# Patient Record
Sex: Male | Born: 1950 | Hispanic: Refuse to answer | Marital: Single | Smoking: Former smoker
Health system: Southern US, Community
[De-identification: ages and names within clinical notes are randomized; demographics above are authoritative.]

## PROBLEM LIST (undated history)

## (undated) DIAGNOSIS — I639 Cerebral infarction, unspecified: Secondary | ICD-10-CM

## (undated) DIAGNOSIS — C329 Malignant neoplasm of larynx, unspecified: Secondary | ICD-10-CM

## (undated) DIAGNOSIS — C3490 Malignant neoplasm of unspecified part of unspecified bronchus or lung: Secondary | ICD-10-CM

## (undated) DIAGNOSIS — Z930 Tracheostomy status: Secondary | ICD-10-CM

## (undated) HISTORY — PX: TRACHEOSTOMY: SUR1362

## (undated) HISTORY — PX: OTHER SURGICAL HISTORY: SHX169

---

## 2009-04-23 ENCOUNTER — Ambulatory Visit: Payer: Self-pay | Admitting: *Deleted

## 2009-05-14 ENCOUNTER — Ambulatory Visit: Payer: Self-pay | Admitting: *Deleted

## 2009-05-27 ENCOUNTER — Ambulatory Visit (HOSPITAL_COMMUNITY): Admission: RE | Admit: 2009-05-27 | Discharge: 2009-05-27 | Payer: Self-pay | Admitting: *Deleted

## 2009-05-27 ENCOUNTER — Ambulatory Visit: Payer: Self-pay | Admitting: *Deleted

## 2009-06-18 ENCOUNTER — Ambulatory Visit: Payer: Self-pay | Admitting: *Deleted

## 2010-01-18 ENCOUNTER — Ambulatory Visit: Payer: Self-pay | Admitting: Surgery

## 2010-08-02 ENCOUNTER — Ambulatory Visit: Payer: Self-pay | Admitting: Surgery

## 2011-02-07 ENCOUNTER — Ambulatory Visit: Payer: Self-pay | Admitting: Surgery

## 2011-03-28 LAB — POCT I-STAT, CHEM 8
Calcium, Ion: 1.13 mmol/L (ref 1.12–1.32)
Chloride: 101 mEq/L (ref 96–112)
HCT: 45 % (ref 39.0–52.0)
Potassium: 4.6 mEq/L (ref 3.5–5.1)
Sodium: 135 mEq/L (ref 135–145)

## 2011-05-03 NOTE — Op Note (Signed)
NAMEGLENDON, William Hoover                 ACCOUNT NO.:  [id number]   MEDICAL RECORD NO.:  [id number]          PATIENT TYPE:  AMB   LOCATION:  SDS                          FACILITY:  [facility]   PHYSICIAN:  [name], M.D.    DATE OF BIRTH:  1951-02-11   DATE OF PROCEDURE:  05/27/2009  DATE OF DISCHARGE:  05/27/2009                               OPERATIVE REPORT   DIAGNOSIS:  Right lower extremity claudication.   PROCEDURE:  Abdominal aortogram with bilateral lower extremity runoff  arteriography.   ACCESS:  Right common femoral artery 5-French sheath.   COMPLICATIONS:  None apparent.   CONTRAST:  120 mL Visipaque.   CLINICAL NOTE:  William Hoover is a 60 year old male with advanced  peripheral vascular disease, history of bilateral iliac stent with  extensive stents in the right superficial femoral artery.  He has a left  femoral-popliteal bypass.  He has limiting claudication in his right  lower extremity, which has been worsening, brought to the cath lab at  this time for further workup with arteriography.   PROCEDURE NOTE:  The patient was brought to the cath lab in stable  condition.  Placed in supine position.  Right groin was prepped and  draped in sterile fashion, administered 50 mcg of fentanyl  intravenously.   Skin and subcutaneous tissue of right groin was instilled with 1%  Xylocaine.  An 18-gauge induced in right common femoral artery.  A 0.035  Versicor guidewire advanced through the needle onto the mid abdominal  aorta.  The 5-French sheath advanced over guidewire, dilator removed,  sheath flushed with heparin saline solution.   Pigtail catheter was then advanced over guidewire.  This was positioned  in the suprarenal aorta.  Standard AP abdominal aortogram obtained.  The  renal arteries were patent bilaterally with mild proximal stenoses.  Brisk flow was noted through the superior mesenteric and celiac vessels.  The inferior mesenteric artery appeared small.   The  patient is status post bilateral iliac stenting.  Extensive stents  along the left common and external iliac arteries.  Left hypogastric  artery was occluded.  Otherwise, no significant left iliac stenosis.  The right iliac system revealed a common iliac stent, hypogastric artery  patent and external iliac artery patent.   Pigtail catheter brought down the aortic bifurcation.  Bilateral lower  extremity runoff arteriography obtained.  This revealed the common  femoral arteries to be patent bilaterally.   Left leg revealed an occluded superficial femoral artery with a patent  femoral-popliteal bypass to the above-knee popliteal segment.  In fact,  intact popliteal artery distal over the bypass with 3-vessel tibial  artery runoff.   Right lower extremity revealed patent profunda femoris artery.  The  right superficial femoral artery was occluded, status post stenting.  The proximal right popliteal artery was occluded.  Popliteal artery at  the knee joint was also occluded.  The profunda femoris artery then  provided extensive collaterals with reconstitute at the proximal tibial  vessels with 3-vessel runoff.   This completed the arteriogram procedure.  No apparent complications.  Guidewire  reinserted and pigtail catheter removed.   FINAL IMPRESSION:  1. Single bilateral renal arteries with mild proximal stenosis.  2. Patent infrarenal aortic segment.  3. Bilateral iliac stents with left hypogastric artery occlusion.  4. Patent left femoral-popliteal bypass with 3-vessel runoff.  5. Occluded right superficial femoral artery and popliteal artery with      reconstitution of tibial vessels x3.   DISPOSITION:  These results will be reviewed further with the patient  and family.  He has limited claudication present and conservative  management will be recommended.      [name], M.D.  Electronically Signed     [name], M.D.  Electronically Signed    PGH/MEDQ  D:   05/27/2009  T:  05/28/2009  Job:  [id number]

## 2011-05-03 NOTE — Assessment & Plan Note (Signed)
OFFICE VISIT   William Hoover, William Hoover  DOB:  May 12, 1951                                       05/14/2009  [id number]#:[id number]   The patient was seen in the office earlier this month with right lower  extremity claudication symptoms.  These have been worsening over the  past couple of years.  He has had multiple previous interventions in his  right lower extremity which have now failed, including PTA with Viabahn  stenting of his right superficial femoral artery.   I reviewed his arteriograms which were carried out over a year ago.  These do reveal right SFA occlusion with multiple stents in place.  Due  to these being over a year old I do think we should go ahead and do  another arteriogram.  This is scheduled for May 27, 2009.   P. Liliane Bade, M.D.  Electronically Signed   PGH/MEDQ  D:  05/14/2009  T:  05/15/2009  Job:  [id number]   cc:   Carlye Grippe, D.O.

## 2011-05-03 NOTE — Assessment & Plan Note (Signed)
OFFICE VISIT   William Hoover, William Hoover  DOB:  05/19/1951                                       06/18/2009  EAVWU#:[id number]   The patient returned to the office today after undergoing an  arteriogram.  This reveals a patent left femoral popliteal bypass with  three-vessel runoff.  Right lower extremity revealed a patent profunda  femoral.  Right superficial femoral artery was occluded status post  stenting.  The proximal right popliteal artery was occluded.  The  popliteal artery is also occluded at the knee joint.  Extensive profunda  collaterals were noted to reconstitute the proximal tibial vessels which  were intact.   Revascularization would require a femoral tibial bypass.  This magnitude  of surgery is typically limited to limb salvage operations.  His  symptoms are strictly claudication at this time.  He no limb-threatening  ischemia.   PHYSICAL EXAMINATION:  Blood pressure 153/78, pulse of 69 per minute.  Lower Extremity:  Reveals intact femoral pulses bilaterally.  Left  popliteal and dorsalis pedis pulses are intact.  He has no palpable  pulses in the popliteal or pedals in the right foot.   The patient's symptoms are related to limiting claudication.  I have  recommended conservative management, as he would require a femoral  tibial bypass for revascularization.  Should he progress to limb  salvage, this would then be indicated.  I will plan to follow up with  him again in 6 months with an ankle brachial index.   [name], M.D.  Electronically Signed   PGH/MEDQ  D:  06/18/2009  T:  06/19/2009  Job:  [id number]   cc:   Mia Creek, D.O.

## 2011-05-03 NOTE — Consult Note (Signed)
VASCULAR SURGERY CONSULTATION   William Hoover, William Hoover  DOB:  [birth year]                                       04/23/2009  [id number]#:[id number]   REFERRING PHYSICIAN:  Romie Jumper, DO.   REFERRAL DIAGNOSIS:  Right lower extremity claudication.   HISTORY:  The patient is a 60 year old male with a history of tobacco  abuse referred for evaluation of significant claudication symptoms in  his right lower extremity.   He has a complex history of peripheral vascular disease.  He has  previously undergone left femoral popliteal bypass by Dr. Conard Novak in  [facility] with thrombosis and thrombolytic therapy.  Also underwent  left lower extremity four compartment fasciotomy in 2006.   He has had multiple procedures on his right lower extremity.  These have  all been percutaneous interventions.  He has had PTA with Viabahn  stenting of his right superficial femoral artery.  He has also had an  atherectomy of his right superficial femoral artery.   He now complains of recurrent claudication symptoms in his right lower  extremity.  These have been present for over a year.  Has undergone a  repeat arteriogram which reveals his right superficial femoral artery to  be occluded at its origin, severe tibial vessel disease.   He does continue to smoke about a half a pack of cigarettes daily.  Has  a history of left thoracotomy for lung cancer in 2006.   PAST MEDICAL HISTORY:  1. Status post left upper lobectomy for lung cancer.  2. Peripheral vascular disease status post left fem-pop and multiple      percutaneous interventional procedures right superficial femoral      artery.  3. COPD.  4. 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   MEDICATIONS:  1. Plavix 75 mg daily.  2. Aspirin 81 mg daily.  3. Spiriva one once daily.  4. Pulmicort 2 puffs b.i.d.  5. Albuterol q.i.d.  6. ProAir HFA p.r.n.   ALLERGIES:  Hydrocodone.   SOCIAL HISTORY:  The patient is married.  Two children from previous  marriage.  On long-term disability.  Smokes one half pack of cigarettes  daily.  No regular alcohol use.   FAMILY HISTORY:  Father deceased age 31 with a history of heart disease.  Mother living age 38.   REVIEW OF SYSTEMS:  Refer to patient encounter form.  The patient notes  shortness of breath with exertion.  Chronic cough.  History of some  wheezing.  He does have joint discomfort.  History of some clotting  abnormalities.  Refer to patient encounter form.   PHYSICAL EXAMINATION:  General:  A thin 60 year old male.  No acute  distress.  Alert and oriented.  Vital signs:  BP 164/96, pulse 78 per  minute, respirations 18 per minute.  HEENT:  Unremarkable.  Neck:  Supple.  No thyromegaly or adenopathy.  Chest:  Equal air entry  bilaterally.  No rales or rhonchi.  No wheezing.  Cardiovascular:  No  carotid bruits.  Normal heart sounds without murmurs.  Regular rate and  rhythm.  No gallops or rubs.  Abdomen:  Soft.  Nontender.  No masses or  organomegaly.  Normal bowel sounds.  Lower extremities:  2+ femoral  pulses bilaterally.  1+ left popliteal and dorsalis pedis.  Absent  posterior tibial.  Absent right popliteal, posterior tibial and dorsalis  pedis pulses.  No ankle edema.  Neurological:  Cranial nerves intact.  Strength equal bilaterally.  1+ reflexes.  Skin:  Intact without rash or  ulceration.   INVESTIGATIONS:  Lower extremity Doppler reveals ABI 1.0 in the left  leg, right leg 0.43 with monophasic arterial waveforms.   IMPRESSION:  1. Peripheral vascular disease with patent left femoral-popliteal      bypass.  Right superficial femoral artery occlusion with      significant claudication symptoms.  2. Tobacco abuse.  3. Chronic obstructive pulmonary disease.  4. Lung cancer.   RECOMMENDATIONS:  The patient does have a severe limiting claudication.  Have recommended discontinuation of tobacco.  Will obtain films from  [facility] of most recent arteriogram.   Plan followup and  further evaluation once these are reviewed.   [name], M.D.  Electronically Signed  PGH/MEDQ  D:  04/23/2009  T:  04/24/2009  Job:  [id number]

## 2011-12-09 ENCOUNTER — Other Ambulatory Visit (HOSPITAL_COMMUNITY): Payer: Self-pay | Admitting: Internal Medicine

## 2011-12-09 DIAGNOSIS — I428 Other cardiomyopathies: Secondary | ICD-10-CM

## 2011-12-21 ENCOUNTER — Encounter (HOSPITAL_COMMUNITY)
Admission: RE | Admit: 2011-12-21 | Discharge: 2011-12-21 | Disposition: A | Discharge status: Home / Self Care | Payer: Medicare HMO | Source: Ambulatory Visit | Attending: Internal Medicine | Admitting: Internal Medicine

## 2011-12-21 DIAGNOSIS — I428 Other cardiomyopathies: Secondary | ICD-10-CM | POA: Insufficient documentation

## 2011-12-22 MED ORDER — TECHNETIUM TC 99M-LABELED RED BLOOD CELLS IV KIT
30.0000 | PACK | Freq: Once | INTRAVENOUS | Status: AC | PRN
  Administered 2011-12-21: 30 via INTRAVENOUS

## 2012-09-03 ENCOUNTER — Encounter: Payer: Self-pay | Admitting: Vascular Surgery

## 2014-01-24 ENCOUNTER — Encounter (HOSPITAL_COMMUNITY): Payer: Self-pay | Admitting: Emergency Medicine

## 2014-01-24 ENCOUNTER — Inpatient Hospital Stay (HOSPITAL_COMMUNITY)
Admission: EM | Admit: 2014-01-24 | Discharge: 2014-02-16 | DRG: 871 | Disposition: E | Payer: Medicare HMO | Attending: Internal Medicine | Admitting: Internal Medicine

## 2014-01-24 ENCOUNTER — Emergency Department (HOSPITAL_COMMUNITY): Payer: Medicare HMO

## 2014-01-24 DIAGNOSIS — E872 Acidosis, unspecified: Secondary | ICD-10-CM | POA: Diagnosis present

## 2014-01-24 DIAGNOSIS — R69 Illness, unspecified: Secondary | ICD-10-CM | POA: Diagnosis present

## 2014-01-24 DIAGNOSIS — Z7902 Long term (current) use of antithrombotics/antiplatelets: Secondary | ICD-10-CM

## 2014-01-24 DIAGNOSIS — E871 Hypo-osmolality and hyponatremia: Secondary | ICD-10-CM

## 2014-01-24 DIAGNOSIS — R6520 Severe sepsis without septic shock: Secondary | ICD-10-CM

## 2014-01-24 DIAGNOSIS — Z7982 Long term (current) use of aspirin: Secondary | ICD-10-CM

## 2014-01-24 DIAGNOSIS — Z79899 Other long term (current) drug therapy: Secondary | ICD-10-CM

## 2014-01-24 DIAGNOSIS — R64 Cachexia: Secondary | ICD-10-CM | POA: Diagnosis present

## 2014-01-24 DIAGNOSIS — T68XXXA Hypothermia, initial encounter: Secondary | ICD-10-CM

## 2014-01-24 DIAGNOSIS — N179 Acute kidney failure, unspecified: Secondary | ICD-10-CM

## 2014-01-24 DIAGNOSIS — Z515 Encounter for palliative care: Secondary | ICD-10-CM

## 2014-01-24 DIAGNOSIS — R198 Other specified symptoms and signs involving the digestive system and abdomen: Secondary | ICD-10-CM | POA: Diagnosis present

## 2014-01-24 DIAGNOSIS — Z87891 Personal history of nicotine dependence: Secondary | ICD-10-CM

## 2014-01-24 DIAGNOSIS — IMO0002 Reserved for concepts with insufficient information to code with codable children: Secondary | ICD-10-CM

## 2014-01-24 DIAGNOSIS — E875 Hyperkalemia: Secondary | ICD-10-CM

## 2014-01-24 DIAGNOSIS — Z930 Tracheostomy status: Secondary | ICD-10-CM

## 2014-01-24 DIAGNOSIS — Z8521 Personal history of malignant neoplasm of larynx: Secondary | ICD-10-CM

## 2014-01-24 DIAGNOSIS — I69998 Other sequelae following unspecified cerebrovascular disease: Secondary | ICD-10-CM

## 2014-01-24 DIAGNOSIS — K631 Perforation of intestine (nontraumatic): Secondary | ICD-10-CM

## 2014-01-24 DIAGNOSIS — Z85118 Personal history of other malignant neoplasm of bronchus and lung: Secondary | ICD-10-CM

## 2014-01-24 DIAGNOSIS — R6521 Severe sepsis with septic shock: Secondary | ICD-10-CM

## 2014-01-24 DIAGNOSIS — R29898 Other symptoms and signs involving the musculoskeletal system: Secondary | ICD-10-CM | POA: Diagnosis present

## 2014-01-24 DIAGNOSIS — Z888 Allergy status to other drugs, medicaments and biological substances status: Secondary | ICD-10-CM

## 2014-01-24 DIAGNOSIS — I10 Essential (primary) hypertension: Secondary | ICD-10-CM

## 2014-01-24 DIAGNOSIS — A419 Sepsis, unspecified organism: Principal | ICD-10-CM

## 2014-01-24 DIAGNOSIS — Z66 Do not resuscitate: Secondary | ICD-10-CM | POA: Diagnosis present

## 2014-01-24 DIAGNOSIS — Z885 Allergy status to narcotic agent status: Secondary | ICD-10-CM

## 2014-01-24 DIAGNOSIS — J189 Pneumonia, unspecified organism: Secondary | ICD-10-CM | POA: Diagnosis present

## 2014-01-24 DIAGNOSIS — R0902 Hypoxemia: Secondary | ICD-10-CM

## 2014-01-24 HISTORY — DX: Malignant neoplasm of unspecified part of unspecified bronchus or lung: C34.90

## 2014-01-24 HISTORY — DX: Malignant neoplasm of larynx, unspecified: C32.9

## 2014-01-24 HISTORY — DX: Tracheostomy status: Z93.0

## 2014-01-24 HISTORY — DX: Cerebral infarction, unspecified: I63.9

## 2014-01-24 LAB — CBC WITH DIFFERENTIAL/PLATELET
BASOS ABS: 0 10*3/uL (ref 0.0–0.1)
Basophils Relative: 0 % (ref 0–1)
EOS PCT: 0 % (ref 0–5)
Eosinophils Absolute: 0 10*3/uL (ref 0.0–0.7)
HEMATOCRIT: 30.7 % — AB (ref 39.0–52.0)
Hemoglobin: 10.1 g/dL — ABNORMAL LOW (ref 13.0–17.0)
LYMPHS ABS: 1.2 10*3/uL (ref 0.7–4.0)
LYMPHS PCT: 17 % (ref 12–46)
MCH: 29.4 pg (ref 26.0–34.0)
MCHC: 32.9 g/dL (ref 30.0–36.0)
MCV: 89.5 fL (ref 78.0–100.0)
MONOS PCT: 6 % (ref 3–12)
Monocytes Absolute: 0.4 10*3/uL (ref 0.1–1.0)
Neutro Abs: 5.3 10*3/uL (ref 1.7–7.7)
Neutrophils Relative %: 77 % (ref 43–77)
PLATELETS: ADEQUATE 10*3/uL (ref 150–400)
RBC: 3.43 MIL/uL — AB (ref 4.22–5.81)
RDW: 15.3 % (ref 11.5–15.5)
WBC MORPHOLOGY: INCREASED
WBC: 6.9 10*3/uL (ref 4.0–10.5)

## 2014-01-24 LAB — COMPREHENSIVE METABOLIC PANEL
ALBUMIN: 2.3 g/dL — AB (ref 3.5–5.2)
ALK PHOS: 82 U/L (ref 39–117)
ALT: 94 U/L — AB (ref 0–53)
AST: 136 U/L — AB (ref 0–37)
BILIRUBIN TOTAL: 0.9 mg/dL (ref 0.3–1.2)
BUN: 58 mg/dL — AB (ref 6–23)
CHLORIDE: 87 meq/L — AB (ref 96–112)
CO2: 11 mEq/L — ABNORMAL LOW (ref 19–32)
Calcium: 9.4 mg/dL (ref 8.4–10.5)
Creatinine, Ser: 3.25 mg/dL — ABNORMAL HIGH (ref 0.50–1.35)
GFR calc Af Amer: 22 mL/min — ABNORMAL LOW (ref >90)
GFR calc non Af Amer: 19 mL/min — ABNORMAL LOW (ref >90)
Glucose, Bld: 92 mg/dL (ref 70–99)
POTASSIUM: 7 meq/L — AB (ref 3.7–5.3)
SODIUM: 128 meq/L — AB (ref 137–147)
TOTAL PROTEIN: 6.8 g/dL (ref 6.0–8.3)

## 2014-01-24 LAB — URINALYSIS, ROUTINE W REFLEX MICROSCOPIC
GLUCOSE, UA: 100 mg/dL — AB
HGB URINE DIPSTICK: NEGATIVE
KETONES UR: NEGATIVE mg/dL
LEUKOCYTES UA: NEGATIVE
Nitrite: NEGATIVE
PH: 5 (ref 5.0–8.0)
PROTEIN: 30 mg/dL — AB
Specific Gravity, Urine: 1.023 (ref 1.005–1.030)
Urobilinogen, UA: 0.2 mg/dL (ref 0.0–1.0)

## 2014-01-24 LAB — POCT I-STAT TROPONIN I: Troponin i, poc: 0.05 ng/mL (ref 0.00–0.08)

## 2014-01-24 LAB — CG4 I-STAT (LACTIC ACID): Lactic Acid, Venous: 10.46 mmol/L — ABNORMAL HIGH (ref 0.5–2.2)

## 2014-01-24 LAB — URINE MICROSCOPIC-ADD ON

## 2014-01-24 MED ORDER — SODIUM CHLORIDE 0.9 % IV SOLN
1000.0000 mL | Freq: Once | INTRAVENOUS | Status: AC
  Administered 2014-01-24: 1000 mL via INTRAVENOUS

## 2014-01-24 MED ORDER — VANCOMYCIN HCL 500 MG IV SOLR
500.0000 mg | Freq: Two times a day (BID) | INTRAVENOUS | Status: DC
  Filled 2014-01-24: qty 500

## 2014-01-24 MED ORDER — DEXTROSE 5 % IV SOLN
1.0000 g | Freq: Three times a day (TID) | INTRAVENOUS | Status: DC
  Filled 2014-01-24: qty 1

## 2014-01-24 MED ORDER — DEXTROSE 5 % IV SOLN
2.0000 g | Freq: Once | INTRAVENOUS | Status: DC
  Filled 2014-01-24: qty 2

## 2014-01-24 MED ORDER — VANCOMYCIN HCL IN DEXTROSE 1-5 GM/200ML-% IV SOLN
1000.0000 mg | Freq: Once | INTRAVENOUS | Status: AC
  Administered 2014-01-24: 1000 mg via INTRAVENOUS
  Filled 2014-01-24: qty 200

## 2014-01-24 MED ORDER — ALBUTEROL SULFATE (2.5 MG/3ML) 0.083% IN NEBU
5.0000 mg | INHALATION_SOLUTION | Freq: Once | RESPIRATORY_TRACT | Status: AC
  Administered 2014-01-24: 5 mg via RESPIRATORY_TRACT
  Filled 2014-01-24: qty 6

## 2014-01-24 MED ORDER — SODIUM CHLORIDE 0.9 % IV SOLN
1000.0000 mL | INTRAVENOUS | Status: DC
  Administered 2014-01-24: 1000 mL via INTRAVENOUS

## 2014-01-24 MED ORDER — NOREPINEPHRINE BITARTRATE 1 MG/ML IJ SOLN
2.0000 ug/min | INTRAVENOUS | Status: DC
  Administered 2014-01-24: 3 ug/min via INTRAVENOUS
  Filled 2014-01-24: qty 4

## 2014-01-24 NOTE — ED Notes (Signed)
Bed: RESA Expected date: 01/19/2014 Expected time: 8:54 PM Means of arrival: Ambulance Comments: 63 yo M  Cancer, trach, low oxygen sat

## 2014-01-24 NOTE — ED Notes (Signed)
CRITICAL VALUE ALERT  Critical value received:  Potassium 7.0 mEq/L Date of notification:  02/14/2014 Time of notification:  2239 Critical value read back:yes Nurse who received alert:  Renita Papa, RN Primary Nurse notified: Face-to-face in Res A at 32 MD notified:  EDP Aline Brochure and EDPA at bedside in Res A Time of first page:  2240

## 2014-01-24 NOTE — Progress Notes (Signed)
   CARE MANAGEMENT ED NOTE 02/04/2014  Patient:  William Hoover, William Hoover   Account Number:  [id number]  Date Initiated:  02/02/2014  Documentation initiated by:  Livia Snellen  Subjective/Objective Assessment:   Patient presents to Ed with decreased saturations to 77%     Subjective/Objective Assessment Detail:   Patient with trach  colar, just recently diagnosed with pnuemonia, and history of metastatic lung cancer.  Temp 94.5.  Lactic acid level  10.46.     Action/Plan:   Patient patienton 8 liters of oxygen   Action/Plan Detail:   Anticipated DC Date:       Status Recommendation to Physician:   Result of Recommendation:    Other ED [place]  Other  PCP issues    Choice offered to / List presented to:            Status of service:  Completed, signed off  ED Comments:   ED Comments Detail:  As per EMs paperwork, patient is from [facility] nursing [facility].  Patient's pcp at [facility] is Dr. Lysle Rubens.  System updated.

## 2014-01-24 NOTE — ED Provider Notes (Signed)
CSN: [id number]     Arrival date & time 02/05/2014  2107 History   First MD Initiated Contact with Patient 02/07/2014 2111     Chief Complaint  Patient presents with  . Shortness of Breath   (Consider location/radiation/quality/duration/timing/severity/associated sxs/prior Treatment) The history is provided by the patient, medical records and the EMS personnel. No language interpreter was used.    William Hoover is a 63 y.o. male  with a hx of metastatic lung cancer, pneumonia diagnosed yesterday presents to the Emergency Department via EMS from [facility] skilled nursing facility after his oxygen saturation dropped to 77% just prior to arrival.  EMS reports patient has been lethargic since their arrival with oxygen saturations in the 70s to 80s on 6 L with a trach collar.  Level 5 caveat due to altered mental status and acuity.  MAR indicates the initiation of Levaquin.  Patient is taking Plavix for anticoagulation.     Past Medical History  Diagnosis Date  . Laryngeal cancer   . Lung cancer   . Tracheostomy in place   . Stroke    Past Surgical History  Procedure Laterality Date  . Tracheostomy    . Lung lobectomy     History reviewed. No pertinent family history. History  Substance Use Topics  . Smoking status: Former Research scientist (life sciences)  . Smokeless tobacco: Not on file  . Alcohol Use: No    Review of Systems  Unable to perform ROS: Acuity of condition    Allergies  Adhesive; Hydrocodone; and Statins  Home Medications   Current Outpatient Rx  Name  Route  Sig  Dispense  Refill  . albuterol (PROVENTIL) (2.5 MG/3ML) 0.083% nebulizer solution   Nebulization   Take 2.5 mg by nebulization every 8 (eight) hours.         Marland Kitchen aspirin 81 MG chewable tablet   Oral   Chew 81 mg by mouth daily.         . carvedilol (COREG) 3.125 MG tablet   Oral   Take 3.125 mg by mouth 2 (two) times daily with a meal.         . clopidogrel (PLAVIX) 75 MG tablet   Oral   Take 75 mg by mouth daily.          Marland Kitchen doxazosin (CARDURA) 2 MG tablet   Oral   Take 2 mg by mouth daily.         Marland Kitchen ipratropium-albuterol (DUONEB) 0.5-2.5 (3) MG/3ML SOLN   Inhalation   Inhale 3 mLs into the lungs every 4 (four) hours as needed.          Marland Kitchen levofloxacin (LEVAQUIN) 500 MG tablet   Oral   Take 500 mg by mouth daily.         Marland Kitchen levothyroxine (SYNTHROID, LEVOTHROID) 50 MCG tablet   Oral   Take 50 mcg by mouth daily before breakfast.          . lisinopril (PRINIVIL,ZESTRIL) 2.5 MG tablet   Oral   Take 2.5 mg by mouth daily.         Marland Kitchen PROAIR HFA 108 (90 BASE) MCG/ACT inhaler   Inhalation   Inhale 1 puff into the lungs every 6 (six) hours as needed.           BP 44/17  Pulse 118  Temp(Src) 94.5 F (34.7 C) (Core (Comment))  Resp 25  SpO2 100% Physical Exam  Nursing note and vitals reviewed. Constitutional: He appears well-developed. He appears distressed.  Awake,  cachectic, distressed, mild diaphoresis  HENT:  Head: Normocephalic and atraumatic.  Mouth/Throat: No oropharyngeal exudate.  Eyes: Conjunctivae are normal. Pupils are equal, round, and reactive to light. No scleral icterus.  Neck: Neck supple.  Well-healed stoma, no cannulation, trach collar in place Palpable carotid pulses  Cardiovascular: Normal rate, regular rhythm and normal heart sounds.   No palpable radial pulses  Pulmonary/Chest: Accessory muscle usage present. Tachypnea noted. He is in respiratory distress. He has decreased breath sounds (Throughout). He has no wheezes. He has no rales. He exhibits no tenderness and no bony tenderness.  Patient tachypneic with accessory muscle use, mild respiratory distress Decreased breath sounds throughout with rales in the right lower  Abdominal: Soft. Bowel sounds are normal. He exhibits no distension and no mass. There is no tenderness. There is no rebound and no guarding.  Abdomen soft and nontender  Musculoskeletal: Normal range of motion. He exhibits no edema.   Lymphadenopathy:    He has no cervical adenopathy.  Neurological: He is alert. GCS eye subscore is 4. GCS verbal subscore is 1. GCS motor subscore is 5.  GCS 10  Skin: Skin is warm. He is diaphoretic.  Jaundiced appearing  Psychiatric: He has a normal mood and affect.    ED Course  Procedures (including critical care time) Labs Review Labs Reviewed  CBC WITH DIFFERENTIAL - Abnormal; Notable for the following:    RBC 3.43 (*)    Hemoglobin 10.1 (*)    HCT 30.7 (*)    All other components within normal limits  COMPREHENSIVE METABOLIC PANEL - Abnormal; Notable for the following:    Sodium 128 (*)    Potassium 7.0 (*)    Chloride 87 (*)    CO2 11 (*)    BUN 58 (*)    Creatinine, Ser 3.25 (*)    Albumin 2.3 (*)    AST 136 (*)    ALT 94 (*)    GFR calc non Af Amer 19 (*)    GFR calc Af Amer 22 (*)    All other components within normal limits  URINALYSIS, ROUTINE W REFLEX MICROSCOPIC - Abnormal; Notable for the following:    Color, Urine AMBER (*)    APPearance CLOUDY (*)    Glucose, UA 100 (*)    Bilirubin Urine SMALL (*)    Protein, ur 30 (*)    All other components within normal limits  CG4 I-STAT (LACTIC ACID) - Abnormal; Notable for the following:    Lactic Acid, Venous 10.46 (*)    All other components within normal limits  CULTURE, BLOOD (ROUTINE X 2)  URINE CULTURE  URINE MICROSCOPIC-ADD ON  POCT I-STAT TROPONIN I   Imaging Review Ct Abdomen Pelvis Wo Contrast  02/06/2014   CLINICAL DATA:  63 year old male -unresponsive. Probable pneumoperitoneum identified on recent chest radiograph.  EXAM: CT ABDOMEN AND PELVIS WITHOUT CONTRAST  TECHNIQUE: Multidetector CT imaging of the abdomen and pelvis was performed following the standard protocol without intravenous contrast.  COMPARISON:  09/04/2013 chest CT and 02/07/2014 chest radiograph  FINDINGS: A large amount of pneumoperitoneum within the mid/ upper abdomen with complex fluid containing foci of gas in the pelvis and  pericolic gutters - compatible with bowel perforation. Difficult to determine the source of this perforation on the study.  Bibasilar atelectasis, right greater than left noted.  The liver, spleen, adrenal glands, pancreas and gallbladder are unremarkable. Please note that parenchymal abnormalities may be missed without intravenous contrast.  Abdominal aortic atherosclerotic calcifications are  noted without aneurysm. No definite enlarged lymph nodes identified.  A Foley catheter within the bladder is present.  The right psoas muscle appears enlarged in relation to the left psoas muscle and suspicious for hematoma.  No acute or suspicious bony abnormalities are present.  IMPRESSION: Evidence of bowel perforation with pneumoperitoneum and complex fluid and gas within the abdomen/pelvis. The site of bowel perforation is not well delineated on this study.  Probable right psoas muscle hematoma.  Critical Value/emergent results were called by telephone at the time of interpretation on 02/01/2014 at 11:30 PM to Dr. Abigail Butts , who verbally acknowledged these results.   Electronically Signed   By: Hassan Rowan M.D.   On: 02/12/2014 23:38   Dg Chest Port 1 View  02/12/2014   CLINICAL DATA:  63 year old male central line placement. Initial encounter. Pneumoperitoneum.  EXAM: PORTABLE CHEST - 1 VIEW  COMPARISON:  2159 hr the same day and earlier.  FINDINGS: Portable AP semi upright view at at 2315 hrs.  Continued pneumoperitoneum. Stable cardiac size and mediastinal contours. Right IJ approach central line placed, tip partially obscured by the thoracic spinal hardware, but felt to be identified at the level of the lower right SVC. No pneumothorax. No confluent pulmonary opacity or pulmonary edema. Stable cardiac size and mediastinal contours.  IMPRESSION: 1. Right IJ central line placed, tip at the level of the lower SVC. No pneumothorax identified. 2. Continued large volume pneumoperitoneum, see CT Abdomen and Pelvis  reported separately.   Electronically Signed   By: Lars Pinks M.D.   On: 01/19/2014 23:30   Dg Chest Port 1 View  01/30/2014   CLINICAL DATA:  Hypoxia  EXAM: PORTABLE CHEST - 1 VIEW  COMPARISON:  Prior radiograph  FINDINGS: Cardiac and mediastinal silhouettes are unchanged. Surgical clips overlie the left hilum. Spinal fixation hardware again noted.  Lungs are hypoinflated patchy and linear right basilar opacities ill favored to reflect atelectasis. No definite focal infiltrate. No pulmonary edema or pleural effusion. No pneumothorax.  Gas lucency seen within the upper abdomen is compatible with free intraperitoneal air. There is a continuous diaphragm sign.  No acute osseus abnormality.  IMPRESSION: 1. Free intraperitoneal air within the partially visualized upper abdomen, concerning for perforated viscus. Dedicated cross-sectional imaging of the abdomen and pelvis is recommended. 2. Right basilar patchy opacities, likely atelectasis. No acute cardiopulmonary process. Critical Value/emergent results were called by telephone at the time of interpretation on 01/29/2014 at 10:20 PM to Dr. Abigail Butts , who verbally acknowledged these results.   Electronically Signed   By: Jeannine Boga M.D.   On: 01/22/2014 22:21    EKG Interpretation    Date/Time:  Friday January 24 2014 21:56:52 EST Ventricular Rate:  79 PR Interval:  175 QRS Duration: 121 QT Interval:  409 QTC Calculation: 469 R Axis:   73 Text Interpretation:  Sinus rhythm Left bundle branch block Confirmed by HARRISON  MD, FORREST ([id number]) on 01/27/2014 11:46:31 PM           CRITICAL CARE Performed by: Abigail Butts Total critical care time: 1 hour Critical care time was exclusive of separately billable procedures and treating other patients. Critical care was necessary to treat or prevent imminent or life-threatening deterioration. Critical care was time spent personally by me on the following activities: development  of treatment plan with patient and/or surrogate as well as nursing, discussions with consultants, evaluation of patient's response to treatment, examination of patient, obtaining history from patient or surrogate, ordering  and performing treatments and interventions, ordering and review of laboratory studies, ordering and review of radiographic studies, pulse oximetry and re-evaluation of patient's condition.   MDM   1. Perforated bowel   2. Hypoxia   3. Hypothermia   4. Hypertension   5. Hyperkalemia   6. Acute renal failure   7. Hyponatremia   8. Admission for end of life care   9. MODS (multiple organ dysfunction syndrome)   10. Septic shock     Keelon Zurn presents with altered mental status, hypoxia, hypotension and hypothermia.  Per EMS patient recently diagnosed with pneumonia and started on Levaquin.  Presumed sepsis at this point. We'll proceed accordingly.   9:59 PM Patient hypotensive, hypoxic and hypothermic at this time.  We'll treat empirically for healthcare acquired pneumonia and sepsis.     11:00PM She continues to be hypotensive. Chest x-ray without evidence of pneumonia but shows free air under the diaphragm.  Family at bedside now. Patient's son reports he fell 2 days ago and within 24 hours of severe abdominal cramping. He was not evaluated after his fall.    Patient no longer hypothermic but does remain mildly hypoxic.  We'll place central line.  Respiratory unable to obtain ABG due to hypertension.  11:54 PM Central line placed by Dr. Aline Brochure.  Discussed at length with patient's son and mother the status of the patient.  Patient with acute renal failure, creatinine 3.25 and no history of same.  Patient also with elevated potassium 7.0.  Mildly elevated AST and ALT. Patient without leukocytosis.  Less likely sepsis at this time, however patient with true left shift as he has increased bands on his differential.    Patient with mildly improving blood pressure on  Levophed.  Discussed CT abdomen pelvis discussed with Dr. Melanee Spry.  Evidence of bowel perforation with pneumoperitoneum and complex fluid and gas within the abdomen pelvis. Unable to determine site of bowel perforation the stomach of contrast. Patient also with likely psoas muscle hematoma.    Pending general surgery consult.  12:35 AM [facility] to evaluate.  Pt with MAP 45 on levophed 21mcg.  On reevaluation patient now with rigid abdomen.  12:57 AM Dr. Excell Seltzer has evaluated the patient. Family is declining surgery at this time.  Patient's abdomen continues to become more distended and rigid. His blood pressure continues to fall.  Patient temperature remains at 41F, core.  He remains hypoxic in the mid-80s.  Family request DO NOT RESUSCITATE be put into place.  DO NOT RESUSCITATE signed and motorized here in the emergency department. Will admit to [facility] for comfort care only.  2:15 AM Pt last BP 44/17, HR 34.  Pt admitted by Dr. Alcario Drought to [facility].  Pt time of death 2:20AM 02-06-14.  Death certificate to be completed by Dr. Jennette Kettle.  William Soho Shenea Giacobbe, PA-C 02-06-2014 [phone]

## 2014-01-24 NOTE — ED Notes (Signed)
Pt arrived via EMS from [facility] with a complaint of a low O2 saturation rate.  Pt has a hx of metastatic lung cancer with a removal of the left upper lobe.  Pt also has a hx of throat cancer.  Pt was sent here due to low O2 saturation rates.

## 2014-01-24 NOTE — Progress Notes (Signed)
ANTIBIOTIC CONSULT NOTE - INITIAL  Pharmacy Consult for Vancomycin & Cefepime Indication: Treat for HCAP, r/o sepsis  Allergies  Allergen Reactions  . Hydrocodone     Per MAR   Patient Measurements:   Stated weight 100lb = 45 kg  Vital Signs: Temp: 94.5 F (34.7 C) (02/06 2200) Temp src: Core (Comment) (02/06 2200) BP: 86/51 mmHg (02/06 2200) Intake/Output from previous day:   Intake/Output from this shift:    Labs: No results found for this basename: WBC, HGB, PLT, LABCREA, CREATININE,  in the last 72 hours CrCl is unknown because there is no height on file for the current visit. No results found for this basename: VANCOTROUGH, VANCOPEAK, VANCORANDOM, GENTTROUGH, GENTPEAK, GENTRANDOM, TOBRATROUGH, TOBRAPEAK, TOBRARND, AMIKACINPEAK, AMIKACINTROU, AMIKACIN,  in the last 72 hours   Microbiology: No results found for this or any previous visit (from the past 720 hour(s)).  Medical History: Hx of metastatic Lung Ca,  Medications:  Scheduled:   Anti-infectives   Start     Dose/Rate Route Frequency Ordered Stop   01/23/2014 2230  vancomycin (VANCOCIN) IVPB 1000 mg/200 mL premix     1,000 mg 200 mL/hr over 60 Minutes Intravenous  Once 01/29/2014 2139     01/26/2014 2200  ceFEPIme (MAXIPIME) 2 g in dextrose 5 % 50 mL IVPB     2 g 100 mL/hr over 30 Minutes Intravenous  Once 01/29/2014 2139       Assessment: 39 yoM from SNF with diagnosis PNA yesterday at facility, Levaquin begun. O2 sats dropped to 70's per EMS, on 6L via trach collar. Transferred to ED via EMS.   Rule-out Sepsis, treat for HCAP  Vancomycin and Cefepime per Pharmacy  Goal of Therapy:  Vancomycin trough level 15-20 mcg/ml  Plan:   To receive Vancomycin 1gm and Cefepime 2gm in ED  Follow with Vancomycin 500mg  IV q12, Cefepime 1gm q8h  Follow blood, urine cultures, renal function  Minda Ditto PharmD Pager [phone] 02/05/2014, 10:17 PM

## 2014-01-24 NOTE — ED Notes (Addendum)
[place], [place] and [place], F. EDP made aware of CG4 Lactic results.

## 2014-01-25 ENCOUNTER — Encounter (HOSPITAL_COMMUNITY): Payer: Self-pay | Admitting: Internal Medicine

## 2014-01-25 DIAGNOSIS — N179 Acute kidney failure, unspecified: Secondary | ICD-10-CM

## 2014-01-25 DIAGNOSIS — R198 Other specified symptoms and signs involving the digestive system and abdomen: Secondary | ICD-10-CM | POA: Diagnosis present

## 2014-01-25 DIAGNOSIS — A419 Sepsis, unspecified organism: Secondary | ICD-10-CM

## 2014-01-25 DIAGNOSIS — IMO0002 Reserved for concepts with insufficient information to code with codable children: Secondary | ICD-10-CM | POA: Diagnosis present

## 2014-01-25 DIAGNOSIS — R69 Illness, unspecified: Secondary | ICD-10-CM

## 2014-01-25 DIAGNOSIS — R6521 Severe sepsis with septic shock: Secondary | ICD-10-CM

## 2014-01-25 DIAGNOSIS — K631 Perforation of intestine (nontraumatic): Secondary | ICD-10-CM

## 2014-01-25 DIAGNOSIS — Z515 Encounter for palliative care: Secondary | ICD-10-CM

## 2014-01-25 DIAGNOSIS — R6520 Severe sepsis without septic shock: Secondary | ICD-10-CM

## 2014-01-25 DIAGNOSIS — E875 Hyperkalemia: Secondary | ICD-10-CM | POA: Diagnosis present

## 2014-01-25 MED ORDER — IPRATROPIUM-ALBUTEROL 0.5-2.5 (3) MG/3ML IN SOLN: 3.0000 mL | RESPIRATORY_TRACT | Status: DC | PRN

## 2014-01-25 MED ORDER — SCOPOLAMINE 1 MG/3DAYS TD PT72
1.0000 | MEDICATED_PATCH | TRANSDERMAL | Status: DC
  Administered 2014-01-25: 1.5 mg via TRANSDERMAL
  Filled 2014-01-25: qty 1

## 2014-01-25 MED ORDER — ALBUTEROL SULFATE (2.5 MG/3ML) 0.083% IN NEBU
2.5000 mg | INHALATION_SOLUTION | Freq: Three times a day (TID) | RESPIRATORY_TRACT | Status: DC
  Filled 2014-01-25: qty 3

## 2014-01-25 MED ORDER — ALBUTEROL SULFATE (2.5 MG/3ML) 0.083% IN NEBU: 2.5000 mg | INHALATION_SOLUTION | Freq: Three times a day (TID) | RESPIRATORY_TRACT | Status: DC

## 2014-01-25 MED ORDER — CALCIUM GLUCONATE 10 % IV SOLN
1.0000 g | Freq: Once | INTRAVENOUS | Status: DC
  Filled 2014-01-25: qty 10

## 2014-01-25 MED ORDER — ALBUTEROL SULFATE (2.5 MG/3ML) 0.083% IN NEBU: 2.5000 mg | INHALATION_SOLUTION | Freq: Four times a day (QID) | RESPIRATORY_TRACT | Status: DC | PRN

## 2014-01-25 MED ORDER — ALBUTEROL SULFATE HFA 108 (90 BASE) MCG/ACT IN AERS: 1.0000 | INHALATION_SPRAY | Freq: Four times a day (QID) | RESPIRATORY_TRACT | Status: DC | PRN

## 2014-01-25 MED ORDER — SODIUM BICARBONATE 8.4 % IV SOLN: 50.0000 meq | Freq: Once | INTRAVENOUS | Status: DC

## 2014-01-25 MED ORDER — SODIUM CHLORIDE 0.9 % IV SOLN
5.0000 mg/h | INTRAVENOUS | Status: DC
  Administered 2014-01-25: 10 mg/h via INTRAVENOUS
  Filled 2014-01-25: qty 10

## 2014-01-26 ENCOUNTER — Encounter: Payer: Self-pay | Admitting: Internal Medicine

## 2014-01-26 LAB — URINE CULTURE
CULTURE: NO GROWTH
Colony Count: NO GROWTH

## 2014-01-31 LAB — CULTURE, BLOOD (ROUTINE X 2): Culture: NO GROWTH

## 2014-02-16 NOTE — ED Provider Notes (Signed)
CSN: [id number]     Arrival date & time 01/27/2014  2107 History   First MD Initiated Contact with Patient 01/28/2014 2111     Chief Complaint  Patient presents with  . Shortness of Breath   (Consider location/radiation/quality/duration/timing/severity/associated sxs/prior Treatment) HPI  Past Medical History  Diagnosis Date  . Laryngeal cancer   . Lung cancer   . Tracheostomy in place   . Stroke    Past Surgical History  Procedure Laterality Date  . Tracheostomy    . Lung lobectomy     History reviewed. No pertinent family history. History  Substance Use Topics  . Smoking status: Former Research scientist (life sciences)  . Smokeless tobacco: Not on file  . Alcohol Use: No    Review of Systems  Allergies  Adhesive; Hydrocodone; and Statins  Home Medications   Current Outpatient Rx  Name  Route  Sig  Dispense  Refill  . albuterol (PROVENTIL) (2.5 MG/3ML) 0.083% nebulizer solution   Nebulization   Take 2.5 mg by nebulization every 8 (eight) hours.         Marland Kitchen aspirin 81 MG chewable tablet   Oral   Chew 81 mg by mouth daily.         . carvedilol (COREG) 3.125 MG tablet   Oral   Take 3.125 mg by mouth 2 (two) times daily with a meal.         . clopidogrel (PLAVIX) 75 MG tablet   Oral   Take 75 mg by mouth daily.         Marland Kitchen doxazosin (CARDURA) 2 MG tablet   Oral   Take 2 mg by mouth daily.         Marland Kitchen ipratropium-albuterol (DUONEB) 0.5-2.5 (3) MG/3ML SOLN   Inhalation   Inhale 3 mLs into the lungs every 4 (four) hours as needed.          Marland Kitchen levofloxacin (LEVAQUIN) 500 MG tablet   Oral   Take 500 mg by mouth daily.         Marland Kitchen levothyroxine (SYNTHROID, LEVOTHROID) 50 MCG tablet   Oral   Take 50 mcg by mouth daily before breakfast.          . lisinopril (PRINIVIL,ZESTRIL) 2.5 MG tablet   Oral   Take 2.5 mg by mouth daily.         Marland Kitchen PROAIR HFA 108 (90 BASE) MCG/ACT inhaler   Inhalation   Inhale 1 puff into the lungs every 6 (six) hours as needed.           BP 44/17   Pulse 118  Temp(Src) 94.5 F (34.7 C) (Core (Comment))  Resp 25  Wt 110 lb (49.896 kg)  SpO2 100% Physical Exam  ED Course  CENTRAL LINE Date/Time: 01/26/2014 11:53 AM Performed by: Pamella Pert, S Authorized by: Pamella Pert, S Consent: Verbal consent not obtained. written consent not obtained. The procedure was performed in an emergent situation. Required items: required blood products, implants, devices, and special equipment available Patient identity confirmed: provided demographic data, arm band, hospital-assigned identification number and anonymous protocol, patient vented/unresponsive Time out: Immediately prior to procedure a "time out" was called to verify the correct patient, procedure, equipment, support staff and site/side marked as required. Indications: vascular access and central pressure monitoring Anesthesia method: none. Patient sedated: no Preparation: skin prepped with 2% chlorhexidine Skin prep agent dried: skin prep agent completely dried prior to procedure Sterile barriers: all five maximum sterile barriers used - cap, mask, sterile gown,  sterile gloves, and large sterile sheet Hand hygiene: hand hygiene performed prior to central venous catheter insertion Location details: right internal jugular Site selection rationale: Easiest to access Patient position: flat Catheter type: triple lumen Pre-procedure: landmarks identified Ultrasound guidance: yes Number of attempts: 1 Successful placement: yes Post-procedure: line sutured and dressing applied Assessment: blood return through all ports, free fluid flow, placement verified by x-ray and no pneumothorax on x-ray Patient tolerance: Patient tolerated the procedure well with no immediate complications.   (including critical care time) Labs Review Labs Reviewed  CBC WITH DIFFERENTIAL - Abnormal; Notable for the following:    RBC 3.43 (*)    Hemoglobin 10.1 (*)    HCT 30.7 (*)    All other  components within normal limits  COMPREHENSIVE METABOLIC PANEL - Abnormal; Notable for the following:    Sodium 128 (*)    Potassium 7.0 (*)    Chloride 87 (*)    CO2 11 (*)    BUN 58 (*)    Creatinine, Ser 3.25 (*)    Albumin 2.3 (*)    AST 136 (*)    ALT 94 (*)    GFR calc non Af Amer 19 (*)    GFR calc Af Amer 22 (*)    All other components within normal limits  URINALYSIS, ROUTINE W REFLEX MICROSCOPIC - Abnormal; Notable for the following:    Color, Urine AMBER (*)    APPearance CLOUDY (*)    Glucose, UA 100 (*)    Bilirubin Urine SMALL (*)    Protein, ur 30 (*)    All other components within normal limits  CG4 I-STAT (LACTIC ACID) - Abnormal; Notable for the following:    Lactic Acid, Venous 10.46 (*)    All other components within normal limits  CULTURE, BLOOD (ROUTINE X 2)  URINE CULTURE  URINE MICROSCOPIC-ADD ON  POCT I-STAT TROPONIN I   Imaging Review Ct Abdomen Pelvis Wo Contrast  02/15/2014   CLINICAL DATA:  62 year old male -unresponsive. Probable pneumoperitoneum identified on recent chest radiograph.  EXAM: CT ABDOMEN AND PELVIS WITHOUT CONTRAST  TECHNIQUE: Multidetector CT imaging of the abdomen and pelvis was performed following the standard protocol without intravenous contrast.  COMPARISON:  09/04/2013 chest CT and 01/31/2014 chest radiograph  FINDINGS: A large amount of pneumoperitoneum within the mid/ upper abdomen with complex fluid containing foci of gas in the pelvis and pericolic gutters - compatible with bowel perforation. Difficult to determine the source of this perforation on the study.  Bibasilar atelectasis, right greater than left noted.  The liver, spleen, adrenal glands, pancreas and gallbladder are unremarkable. Please note that parenchymal abnormalities may be missed without intravenous contrast.  Abdominal aortic atherosclerotic calcifications are noted without aneurysm. No definite enlarged lymph nodes identified.  A Foley catheter within the bladder  is present.  The right psoas muscle appears enlarged in relation to the left psoas muscle and suspicious for hematoma.  No acute or suspicious bony abnormalities are present.  IMPRESSION: Evidence of bowel perforation with pneumoperitoneum and complex fluid and gas within the abdomen/pelvis. The site of bowel perforation is not well delineated on this study.  Probable right psoas muscle hematoma.  Critical Value/emergent results were called by telephone at the time of interpretation on 02/11/2014 at 11:30 PM to Dr. Abigail Butts , who verbally acknowledged these results.   Electronically Signed   By: Hassan Rowan M.D.   On: 01/23/2014 23:38   Dg Chest Port 1 View  02/13/2014  CLINICAL DATA:  63 year old male central line placement. Initial encounter. Pneumoperitoneum.  EXAM: PORTABLE CHEST - 1 VIEW  COMPARISON:  2159 hr the same day and earlier.  FINDINGS: Portable AP semi upright view at at 2315 hrs.  Continued pneumoperitoneum. Stable cardiac size and mediastinal contours. Right IJ approach central line placed, tip partially obscured by the thoracic spinal hardware, but felt to be identified at the level of the lower right SVC. No pneumothorax. No confluent pulmonary opacity or pulmonary edema. Stable cardiac size and mediastinal contours.  IMPRESSION: 1. Right IJ central line placed, tip at the level of the lower SVC. No pneumothorax identified. 2. Continued large volume pneumoperitoneum, see CT Abdomen and Pelvis reported separately.   Electronically Signed   By: Lars Pinks M.D.   On: 02/11/2014 23:30   Dg Chest Port 1 View  02/04/2014   CLINICAL DATA:  Hypoxia  EXAM: PORTABLE CHEST - 1 VIEW  COMPARISON:  Prior radiograph  FINDINGS: Cardiac and mediastinal silhouettes are unchanged. Surgical clips overlie the left hilum. Spinal fixation hardware again noted.  Lungs are hypoinflated patchy and linear right basilar opacities ill favored to reflect atelectasis. No definite focal infiltrate. No pulmonary edema  or pleural effusion. No pneumothorax.  Gas lucency seen within the upper abdomen is compatible with free intraperitoneal air. There is a continuous diaphragm sign.  No acute osseus abnormality.  IMPRESSION: 1. Free intraperitoneal air within the partially visualized upper abdomen, concerning for perforated viscus. Dedicated cross-sectional imaging of the abdomen and pelvis is recommended. 2. Right basilar patchy opacities, likely atelectasis. No acute cardiopulmonary process. Critical Value/emergent results were called by telephone at the time of interpretation on 02/07/2014 at 10:20 PM to Dr. Abigail Butts , who verbally acknowledged these results.   Electronically Signed   By: Jeannine Boga M.D.   On: 01/23/2014 22:21    EKG Interpretation    Date/Time:  Friday January 24 2014 21:56:52 EST Ventricular Rate:  79 PR Interval:  175 QRS Duration: 121 QT Interval:  409 QTC Calculation: 469 R Axis:   73 Text Interpretation:  Sinus rhythm Left bundle branch block Confirmed by Kadelyn Dimascio  MD, Sharvi Mooneyhan ([id number]) on 02/12/2014 11:46:31 PM            MDM   1. Perforated bowel   2. Hypoxia   3. Hypothermia   4. Hypertension   5. Hyperkalemia   6. Acute renal failure   7. Hyponatremia   8. Admission for end of life care   9. MODS (multiple organ dysfunction syndrome)   10. Septic shock    Medical screening examination/treatment/procedure(s) were conducted as a shared visit with non-physician practitioner(s) and myself.  I personally evaluated the patient during the encounter.  I interviewed and examined the patient. Diminished lung sounds throughout, tachypnea. Cardiac exam wnl. Abdomen initially soft. GCS approximately a 9. He is not following commands, airway currently stable. Pt has tracheostomy. Will cover for sepsis as EMS reporting pt has PNA. Critical care notified by PA.   Pt's BP remains low despite IVF. CXR showing free air in abdomen. Son now here stating pt would not want  breathing tube, but would want resuscitated if cardiac arrest. I emergently place a central line to the Right IJ as the pt has poor access and will require pressors. Will start levophed and continue IVF resuscitation. PA to contact [facility] about free air. Will get CT of abdomen.   [facility] saw pt and spoke w/ family. Now that more family here, family  has decided not to proceed w/ surgery. Pt made DNR. Will admit to hospitalist for palliative care.     Blanchard Kelch, MD 02-12-2014 [phone]

## 2014-02-16 NOTE — ED Provider Notes (Signed)
Please see other note by me.   Blanchard Kelch, MD Feb 03, 2014 [phone]

## 2014-02-16 NOTE — Consult Note (Signed)
Reason for Consult: perforated viscus Referring Physician: EDP, William Hoover is an 63 y.o. male.  HPI: I was called to see the patient in the emergency department due to evidence of a perforated viscus on CT scan. History unable to obtain this from the family. The patient has a remote history of lung cancer. He had a laryngectomy at [facility] in September. He was readmitted some time more recently with an apparent stroke with some numbness and weakness on his right side. The patient has recently been in a nursing facility and is somewhat declining condition.  He was apparently recently started on antibiotics for pneumonia. He has expressed to his family that he does not want to be on a ventilator. He became acutely ill at the nursing facility with hypoxia and lethargy and was brought to [facility] emergency department. CT scan which I reviewed as below shows a large amount of free air and fluid in the abdomen consistent with perforated viscus. The patient does not responsive and unable to give a history. He has been hypotensive and on a norepinephrine drip since arrival in the emergency department.  No past medical history on file.  Past surgical history: Significant for previous pulmonary lobectomy for cancer and total laryngectomy and tracheostomy in September of last year.  History reviewed. No pertinent family history.  Social History:  reports that he has quit smoking. He does not have any smokeless tobacco history on file. He reports that he does not drink alcohol or use illicit drugs.  Allergies:  Allergies  Allergen Reactions  . Adhesive [Tape]     Per MAR  . Hydrocodone     Per MAR  . Statins     Per MAR    Medications: Prior to Admission:  (Not in a hospital admission)  Results for orders placed during the hospital encounter of 01/31/2014 (from the past 48 hour(s))  CBC WITH DIFFERENTIAL     Status: Abnormal   Collection Time    02/14/2014  9:38 PM       Result Value Range   WBC 6.9  4.0 - 10.5 K/uL   Comment: WHITE COUNT CONFIRMED ON SMEAR   RBC 3.43 (*) 4.22 - 5.81 MIL/uL   Hemoglobin 10.1 (*) 13.0 - 17.0 g/dL   HCT 30.7 (*) 39.0 - 52.0 %   MCV 89.5  78.0 - 100.0 fL   MCH 29.4  26.0 - 34.0 pg   MCHC 32.9  30.0 - 36.0 g/dL   RDW 15.3  11.5 - 15.5 %   Platelets    150 - 400 K/uL   Value: PLATELET CLUMPS NOTED ON SMEAR, COUNT APPEARS ADEQUATE   Comment: COUNT MAY BE INACCURATE DUE TO FIBRIN CLUMPS.   Neutrophils Relative % 77  43 - 77 %   Lymphocytes Relative 17  12 - 46 %   Monocytes Relative 6  3 - 12 %   Eosinophils Relative 0  0 - 5 %   Basophils Relative 0  0 - 1 %   Neutro Abs 5.3  1.7 - 7.7 K/uL   Lymphs Abs 1.2  0.7 - 4.0 K/uL   Monocytes Absolute 0.4  0.1 - 1.0 K/uL   Eosinophils Absolute 0.0  0.0 - 0.7 K/uL   Basophils Absolute 0.0  0.0 - 0.1 K/uL   WBC Morphology INCREASED BANDS (>20% BANDS)     Comment: MILD LEFT SHIFT (1-5% METAS, OCC MYELO, OCC BANDS)     VACUOLATED NEUTROPHILS  TOXIC GRANULATION     DOHLE BODIES   Smear Review PLATELET COUNT CONFIRMED BY SMEAR     Comment: GIANT PLATELETS SEEN  COMPREHENSIVE METABOLIC PANEL     Status: Abnormal   Collection Time    01/22/2014  9:38 PM      Result Value Range   Sodium 128 (*) 137 - 147 mEq/L   Comment: REPEATED TO VERIFY   Potassium 7.0 (*) 3.7 - 5.3 mEq/L   Comment: NO VISIBLE HEMOLYSIS     CRITICAL RESULT CALLED TO, READ BACK BY AND VERIFIED WITH:     BEANE,J RN @2239  ON 02.06.2015 BY MCREYNOLDS,B   Chloride 87 (*) 96 - 112 mEq/L   Comment: REPEATED TO VERIFY   CO2 11 (*) 19 - 32 mEq/L   Comment: REPEATED TO VERIFY   Glucose, Bld 92  70 - 99 mg/dL   BUN 58 (*) 6 - 23 mg/dL   Creatinine, Ser 3.25 (*) 0.50 - 1.35 mg/dL   Calcium 9.4  8.4 - 10.5 mg/dL   Total Protein 6.8  6.0 - 8.3 g/dL   Albumin 2.3 (*) 3.5 - 5.2 g/dL   AST 136 (*) 0 - 37 U/L   ALT 94 (*) 0 - 53 U/L   Alkaline Phosphatase 82  39 - 117 U/L   Total Bilirubin 0.9  0.3 - 1.2 mg/dL    GFR calc non Af Amer 19 (*) >90 mL/min   GFR calc Af Amer 22 (*) >90 mL/min   Comment: (NOTE)     The eGFR has been calculated using the CKD EPI equation.     This calculation has not been validated in all clinical situations.     eGFR's persistently <90 mL/min signify possible Chronic Kidney     Disease.  POCT I-STAT TROPONIN I     Status: None   Collection Time    02/14/2014  9:48 PM      Result Value Range   Troponin i, poc 0.05  0.00 - 0.08 ng/mL   Comment 3            Comment: Due to the release kinetics of cTnI,     a negative result within the first hours     of the onset of symptoms does not rule out     myocardial infarction with certainty.     If myocardial infarction is still suspected,     repeat the test at appropriate intervals.  CG4 I-STAT (LACTIC ACID)     Status: Abnormal   Collection Time    01/26/2014  9:49 PM      Result Value Range   Lactic Acid, Venous 10.46 (*) 0.5 - 2.2 mmol/L  URINALYSIS, ROUTINE W REFLEX MICROSCOPIC     Status: Abnormal   Collection Time    02/13/2014 10:02 PM      Result Value Range   Color, Urine AMBER (*) YELLOW   Comment: BIOCHEMICALS MAY BE AFFECTED BY COLOR   APPearance CLOUDY (*) CLEAR   Specific Gravity, Urine 1.023  1.005 - 1.030   pH 5.0  5.0 - 8.0   Glucose, UA 100 (*) NEGATIVE mg/dL   Hgb urine dipstick NEGATIVE  NEGATIVE   Bilirubin Urine SMALL (*) NEGATIVE   Ketones, ur NEGATIVE  NEGATIVE mg/dL   Protein, ur 30 (*) NEGATIVE mg/dL   Urobilinogen, UA 0.2  0.0 - 1.0 mg/dL   Nitrite NEGATIVE  NEGATIVE   Leukocytes, UA NEGATIVE  NEGATIVE  URINE MICROSCOPIC-ADD ON  Status: None   Collection Time    02/13/2014 10:02 PM      Result Value Range   Sperm, UA PRESENT     Urine-Other AMORPHOUS URATES/PHOSPHATES      Ct Abdomen Pelvis Wo Contrast  01/23/2014   CLINICAL DATA:  63 year old male -unresponsive. Probable pneumoperitoneum identified on recent chest radiograph.  EXAM: CT ABDOMEN AND PELVIS WITHOUT CONTRAST  TECHNIQUE:  Multidetector CT imaging of the abdomen and pelvis was performed following the standard protocol without intravenous contrast.  COMPARISON:  09/04/2013 chest CT and 01/23/2014 chest radiograph  FINDINGS: A large amount of pneumoperitoneum within the mid/ upper abdomen with complex fluid containing foci of gas in the pelvis and pericolic gutters - compatible with bowel perforation. Difficult to determine the source of this perforation on the study.  Bibasilar atelectasis, right greater than left noted.  The liver, spleen, adrenal glands, pancreas and gallbladder are unremarkable. Please note that parenchymal abnormalities may be missed without intravenous contrast.  Abdominal aortic atherosclerotic calcifications are noted without aneurysm. No definite enlarged lymph nodes identified.  A Foley catheter within the bladder is present.  The right psoas muscle appears enlarged in relation to the left psoas muscle and suspicious for hematoma.  No acute or suspicious bony abnormalities are present.  IMPRESSION: Evidence of bowel perforation with pneumoperitoneum and complex fluid and gas within the abdomen/pelvis. The site of bowel perforation is not well delineated on this study.  Probable right psoas muscle hematoma.  Critical Value/emergent results were called by telephone at the time of interpretation on 02/03/2014 at 11:30 PM to Dr. Abigail Butts , who verbally acknowledged these results.   Electronically Signed   By: Hassan Rowan M.D.   On: 02/11/2014 23:38   Dg Chest Port 1 View  02/09/2014   CLINICAL DATA:  63 year old male central line placement. Initial encounter. Pneumoperitoneum.  EXAM: PORTABLE CHEST - 1 VIEW  COMPARISON:  2159 hr the same day and earlier.  FINDINGS: Portable AP semi upright view at at 2315 hrs.  Continued pneumoperitoneum. Stable cardiac size and mediastinal contours. Right IJ approach central line placed, tip partially obscured by the thoracic spinal hardware, but felt to be identified at  the level of the lower right SVC. No pneumothorax. No confluent pulmonary opacity or pulmonary edema. Stable cardiac size and mediastinal contours.  IMPRESSION: 1. Right IJ central line placed, tip at the level of the lower SVC. No pneumothorax identified. 2. Continued large volume pneumoperitoneum, see CT Abdomen and Pelvis reported separately.   Electronically Signed   By: Lars Pinks M.D.   On: 02/12/2014 23:30   Dg Chest Port 1 View  01/23/2014   CLINICAL DATA:  Hypoxia  EXAM: PORTABLE CHEST - 1 VIEW  COMPARISON:  Prior radiograph  FINDINGS: Cardiac and mediastinal silhouettes are unchanged. Surgical clips overlie the left hilum. Spinal fixation hardware again noted.  Lungs are hypoinflated patchy and linear right basilar opacities ill favored to reflect atelectasis. No definite focal infiltrate. No pulmonary edema or pleural effusion. No pneumothorax.  Gas lucency seen within the upper abdomen is compatible with free intraperitoneal air. There is a continuous diaphragm sign.  No acute osseus abnormality.  IMPRESSION: 1. Free intraperitoneal air within the partially visualized upper abdomen, concerning for perforated viscus. Dedicated cross-sectional imaging of the abdomen and pelvis is recommended. 2. Right basilar patchy opacities, likely atelectasis. No acute cardiopulmonary process. Critical Value/emergent results were called by telephone at the time of interpretation on 02/01/2014 at 10:20 PM to Dr.  [facility] , who verbally acknowledged these results.   Electronically Signed   By: Jeannine Boga M.D.   On: 02/05/2014 22:21    Review of Systems  Unable to perform ROS  Blood pressure 82/19, pulse 34, temperature 94.5 F (34.7 C), temperature source Core (Comment), resp. rate 27, SpO2 90.00%. Physical Exam General: Ashen, cachectic Caucasian male, not responsive to pain or verbal stimuli and labored almost agonal respirations Skin: Pale and cool HEENT: Permanent tracheostomy well  healed. No masses. Lungs: Breath sounds clear but poor respiratory effort Cardiac: Marked bradycardia. Extremities cool Abdomen: Generally firm without distention or mass and patient is not responsive to pain to assess tenderness  Assessment/Plan: Patient with multiple medical problems and recent laryngectomy last September for laryngeal cancer. This was all done in [place] and we do not have detailed records. Patient obviously has a perforated viscus and is in shock with acidosis, hypotension, acute renal insufficiency and severe hyperkalemia. I discussed with the family present but he is critically ill and I believe unlikely to survive a laparotomy. They state he has clearly expressed a desire not to be on a ventilator in the ICU and they do not desire surgical intervention. I agree with that decision based on his chronic state and severe acute illness. Discussed with the emergency department physician and plans are for comfort measures only.  Odelle Kosier T 2014-01-28, 12:53 AM

## 2014-02-16 NOTE — H&P (Signed)
[facility] History and Physical  William Hoover MCN:[id number] DOB: 12-31-1950 DOA: 02/01/2014  Referring physician: EDP PCP: Wenda Low, MD   Chief Complaint: Lethargic   HPI: William Hoover is a 63 y.o. male who is brought in from his SNF with lethargy.  He has been in declining condition at his SNF even at baseline after a stroke, pulmonary lobectomy, and laryngectomy with tracheostomy in Sept of last year.  Today he became acutely ill at the SNF with hypoxia, and was brought to [facility] ED.  Here at the ED he is found to be in frank septic shock with MODS, the patient is hypotensive even on 15 mic of levophed in the ED, this secondary to perforated viscous.  Review of Systems: Systems reviewed.  As above, otherwise negative  Past Medical History  Diagnosis Date  . Laryngeal cancer   . Lung cancer   . Tracheostomy in place   . Stroke    Past Surgical History  Procedure Laterality Date  . Tracheostomy    . Lung lobectomy     Social History:  reports that he has quit smoking. He does not have any smokeless tobacco history on file. He reports that he does not drink alcohol or use illicit drugs.  Allergies  Allergen Reactions  . Adhesive [Tape]     Per MAR  . Hydrocodone     Per MAR  . Statins     Per MAR    History reviewed. No pertinent family history.   Prior to Admission medications   Medication Sig Start Date End Date Taking? Authorizing Provider  albuterol (PROVENTIL) (2.5 MG/3ML) 0.083% nebulizer solution Take 2.5 mg by nebulization every 8 (eight) hours.   Yes Historical Provider, MD  aspirin 81 MG chewable tablet Chew 81 mg by mouth daily.   Yes Historical Provider, MD  carvedilol (COREG) 3.125 MG tablet Take 3.125 mg by mouth 2 (two) times daily with a meal.   Yes Historical Provider, MD  clopidogrel (PLAVIX) 75 MG tablet Take 75 mg by mouth daily. 11/18/13  Yes Historical Provider, MD  doxazosin (CARDURA) 2 MG tablet Take 2 mg by mouth daily. 12/24/13  Yes  Historical Provider, MD  ipratropium-albuterol (DUONEB) 0.5-2.5 (3) MG/3ML SOLN Inhale 3 mLs into the lungs every 4 (four) hours as needed.  11/22/13  Yes Historical Provider, MD  levofloxacin (LEVAQUIN) 500 MG tablet Take 500 mg by mouth daily. 01/23/14 01/30/14 Yes Historical Provider, MD  levothyroxine (SYNTHROID, LEVOTHROID) 50 MCG tablet Take 50 mcg by mouth daily before breakfast.  12/24/13  Yes Historical Provider, MD  lisinopril (PRINIVIL,ZESTRIL) 2.5 MG tablet Take 2.5 mg by mouth daily. 12/27/13  Yes Historical Provider, MD  PROAIR HFA 108 (90 BASE) MCG/ACT inhaler Inhale 1 puff into the lungs every 6 (six) hours as needed.  12/24/13  Yes Historical Provider, MD   Physical Exam: Filed Vitals:   2014-02-23 0105  BP: 73/36  Pulse:   Temp:   Resp: 24    BP 73/36  Pulse 118  Temp(Src) 94.5 F (34.7 C) (Core (Comment))  Resp 24  SpO2 100%  General Appearance:    Awake, lethargic, gasping for breath, frankly toxic appearing, hypothermic to touch.  Remainder of exam deferred due to patient actively dying  Labs on Admission:  Basic Metabolic Panel:  Recent Labs Lab 02/01/2014 2138  NA 128*  K 7.0*  CL 87*  CO2 11*  GLUCOSE 92  BUN 58*  CREATININE 3.25*  CALCIUM 9.4   Liver Function Tests:  Recent Labs Lab 02/02/2014 2138  AST 136*  ALT 94*  ALKPHOS 82  BILITOT 0.9  PROT 6.8  ALBUMIN 2.3*   No results found for this basename: LIPASE, AMYLASE,  in the last 168 hours No results found for this basename: AMMONIA,  in the last 168 hours CBC:  Recent Labs Lab 02/10/2014 2138  WBC 6.9  NEUTROABS 5.3  HGB 10.1*  HCT 30.7*  MCV 89.5  PLT PLATELET CLUMPS NOTED ON SMEAR, COUNT APPEARS ADEQUATE   Cardiac Enzymes: No results found for this basename: CKTOTAL, CKMB, CKMBINDEX, TROPONINI,  in the last 168 hours  BNP (last 3 results) No results found for this basename: PROBNP,  in the last 8760  hours CBG: No results found for this basename: GLUCAP,  in the last 168 hours  Radiological Exams on Admission: Ct Abdomen Pelvis Wo Contrast  02/04/2014   CLINICAL DATA:  63 year old male -unresponsive. Probable pneumoperitoneum identified on recent chest radiograph.  EXAM: CT ABDOMEN AND PELVIS WITHOUT CONTRAST  TECHNIQUE: Multidetector CT imaging of the abdomen and pelvis was performed following the standard protocol without intravenous contrast.  COMPARISON:  09/04/2013 chest CT and 02/05/2014 chest radiograph  FINDINGS: A large amount of pneumoperitoneum within the mid/ upper abdomen with complex fluid containing foci of gas in the pelvis and pericolic gutters - compatible with bowel perforation. Difficult to determine the source of this perforation on the study.  Bibasilar atelectasis, right greater than left noted.  The liver, spleen, adrenal glands, pancreas and gallbladder are unremarkable. Please note that parenchymal abnormalities may be missed without intravenous contrast.  Abdominal aortic atherosclerotic calcifications are noted without aneurysm. No definite enlarged lymph nodes identified.  A Foley catheter within the bladder is present.  The right psoas muscle appears enlarged in relation to the left psoas muscle and suspicious for hematoma.  No acute or suspicious bony abnormalities are present.  IMPRESSION: Evidence of bowel perforation with pneumoperitoneum and complex fluid and gas within the abdomen/pelvis. The site of bowel perforation is not well delineated on this study.  Probable right psoas muscle hematoma.  Critical Value/emergent results were called by telephone at the time of interpretation on 02/11/2014 at 11:30 PM to Dr. Abigail Butts , who verbally acknowledged these results.   Electronically Signed   By: Hassan Rowan M.D.   On: 02/10/2014 23:38   Dg Chest Port 1 View  02/08/2014   CLINICAL DATA:  63 year old male central line placement. Initial encounter. Pneumoperitoneum.   EXAM: PORTABLE CHEST - 1 VIEW  COMPARISON:  2159 hr the same day and earlier.  FINDINGS: Portable AP semi upright view at at 2315 hrs.  Continued pneumoperitoneum. Stable cardiac size and mediastinal contours. Right IJ approach central line placed, tip partially obscured by the thoracic spinal hardware, but felt to be identified at the level of the lower right SVC. No pneumothorax. No confluent pulmonary opacity or pulmonary edema. Stable cardiac size and mediastinal contours.  IMPRESSION: 1. Right IJ central line placed, tip at the level of the lower SVC. No pneumothorax identified. 2. Continued large volume pneumoperitoneum, see CT Abdomen and Pelvis reported separately.   Electronically Signed   By: Lars Pinks M.D.   On: 02/13/2014 23:30   Dg Chest [facility] 1 [address]  02/06/2014   CLINICAL DATA:  Hypoxia  EXAM: PORTABLE CHEST - 1 VIEW  COMPARISON:  Prior radiograph  FINDINGS: Cardiac and mediastinal silhouettes are unchanged. Surgical clips overlie the left hilum. Spinal fixation hardware again noted.  Lungs are hypoinflated patchy and linear right basilar opacities ill favored to reflect atelectasis. No definite focal infiltrate. No pulmonary edema or pleural effusion. No pneumothorax.  Gas lucency seen within the upper abdomen is compatible with free intraperitoneal air. There is a continuous diaphragm sign.  No acute osseus abnormality.  IMPRESSION: 1. Free intraperitoneal air within the partially visualized upper abdomen, concerning for perforated viscus. Dedicated cross-sectional imaging of the abdomen and pelvis is recommended. 2. Right basilar patchy opacities, likely atelectasis. No acute cardiopulmonary process. Critical Value/emergent results were called by telephone at the time of interpretation on 02/14/2014 at 10:20 PM to Dr. Abigail Butts , who verbally acknowledged these results.   Electronically Signed   By: Jeannine Boga M.D.   On: 02/06/2014 22:21    EKG: Independently  reviewed.  Assessment/Plan Principal Problem:   Admission for end of life care Active Problems:   MODS (multiple organ dysfunction syndrome)   Septic shock   Perforated viscus   Hyperkalemia   1. Admission for end of life care - This patient has septic shock with MODS, hyperkalemia due to renal failure which in turn is due to profound hypotension.  He initially was put on levophed and surgery has seen the patient in consult; however, patient is unlikely to survive a laparotomy at this point, and per family the patient has clearly expressed a desire previously to not be on a ventilator in the ICU.  Family has elected to proceed to comfort measures only at this time.  They are aware that patient likely will not survive the night.  Will admit and try and make him as comfortable as possible for his last few hours.  Code Status: Comfort measures only, all other treatments stopped  Family Communication: Numerous family members are all at bedside Disposition Plan: Admit to inpatient hospice for end of life care   Time spent: 23 min  William Hoover M. [facility] Pager [phone]  If 7AM-7PM, please contact the day team taking care of the patient [email or web address] Password [email or web address] February 01, 2014, 1:38 AM

## 2014-02-16 NOTE — Discharge Summary (Signed)
Death note summary: Patient began to brady down and went into asystole.  Family and pastor at bedside.  Patient pronounced at 2:20 AM.  Cause of death will be Sepsis with MODS secondary to perforated viscus.

## 2014-02-16 DEATH — deceased

## 2014-11-28 IMAGING — CR DG CHEST 1V PORT
1 series · 1 of 1 positions shown · non-contrast
Comparison: Prior radiograph

CLINICAL DATA: Hypoxia

EXAM:
PORTABLE CHEST - 1 VIEW

[AP]
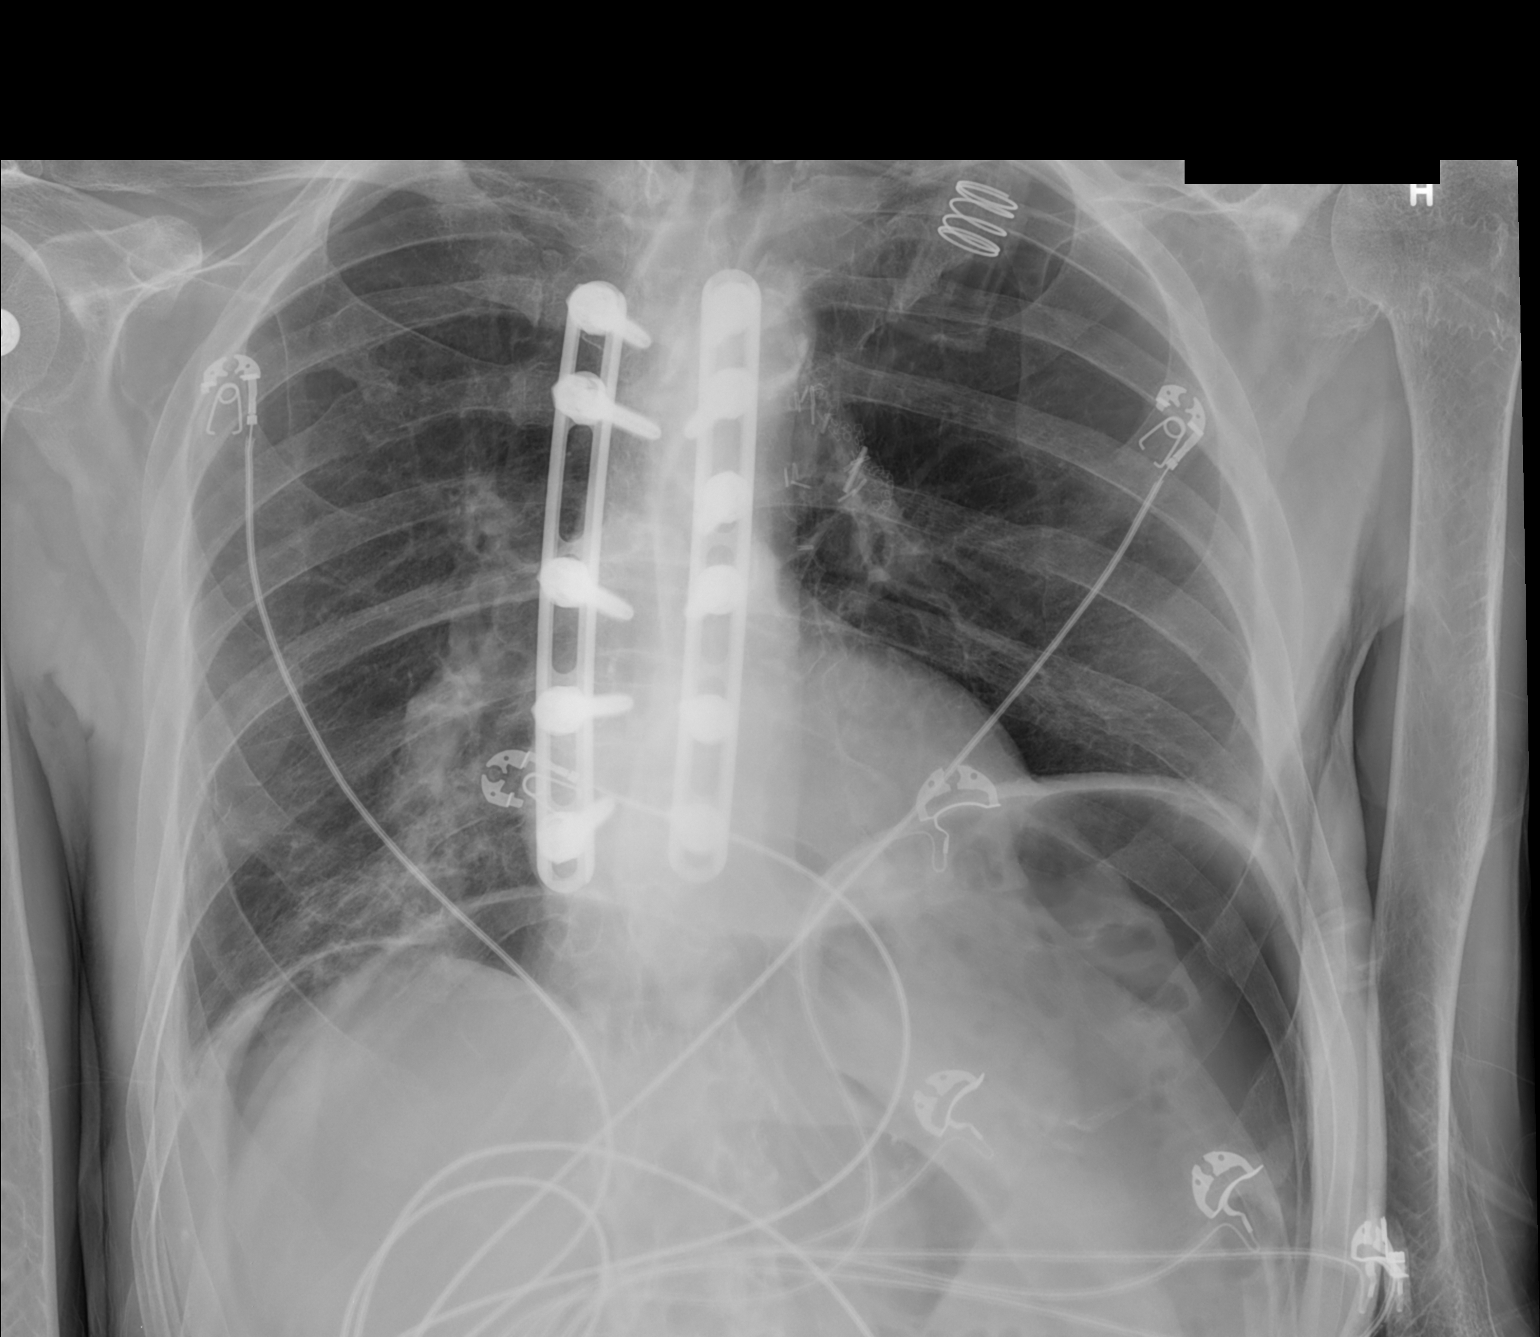

[1 of 1 positions shown; findings below may reference images not displayed]

FINDINGS: Cardiac and mediastinal silhouettes are unchanged. Surgical clips
overlie the left hilum. Spinal fixation hardware again noted.

Lungs are hypoinflated patchy and linear right basilar opacities ill
favored to reflect atelectasis. No definite focal infiltrate. No
pulmonary edema or pleural effusion. No pneumothorax.

Gas lucency seen within the upper abdomen is compatible with free
intraperitoneal air. There is a continuous diaphragm sign.

No acute osseus abnormality.
IMPRESSION: 1. Free intraperitoneal air within the partially visualized upper
abdomen, concerning for perforated viscus. Dedicated cross-sectional
imaging of the abdomen and pelvis is recommended.
2. Right basilar patchy opacities, likely atelectasis. No acute
cardiopulmonary process.
Critical Value/emergent results were called by telephone at the time
of interpretation on 01/24/2014 at [DATE] to Dr. DEIVARAS BARADINSKAS
, who verbally acknowledged these results.

## 2014-11-28 IMAGING — CT CT ABD-PELV W/O CM
1 of 2 series · 15 of 32 positions shown, 19 images · non-contrast
Comparison: 09/04/2013 chest CT and 01/24/2014 chest radiograph

CLINICAL DATA: 62-year-old male -unresponsive. Probable
pneumoperitoneum identified on recent chest radiograph.

EXAM:
CT ABDOMEN AND PELVIS WITHOUT CONTRAST
TECHNIQUE: Multidetector CT imaging of the abdomen and pelvis was performed
following the standard protocol without intravenous contrast.

[Series 2: abd/pel w/o · axial · non-contrast · 0.64mm/px · z∈[+1277,+1667]mm · 15 of 86 slices shown, 19 images]
[im 4/86  soft-tissue]
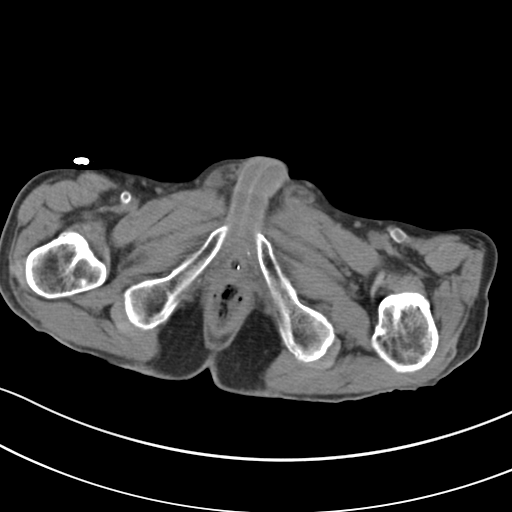
[im 4/86  bone]
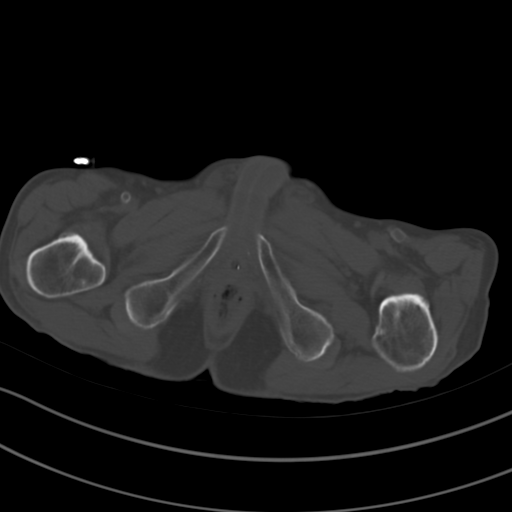
[im 11/86  soft-tissue]
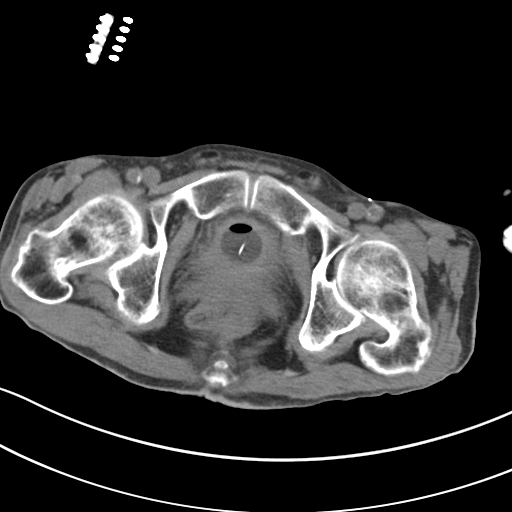
[im 18/86  soft-tissue]
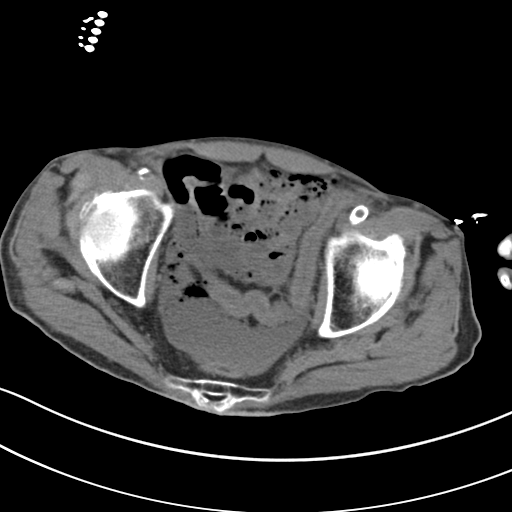
[im 24/86  soft-tissue]
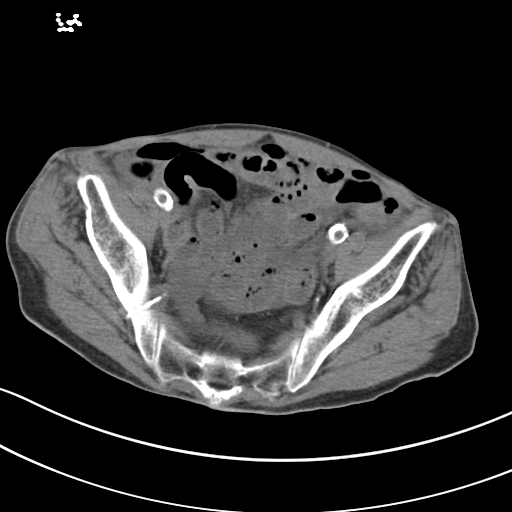
[im 31/86  soft-tissue]
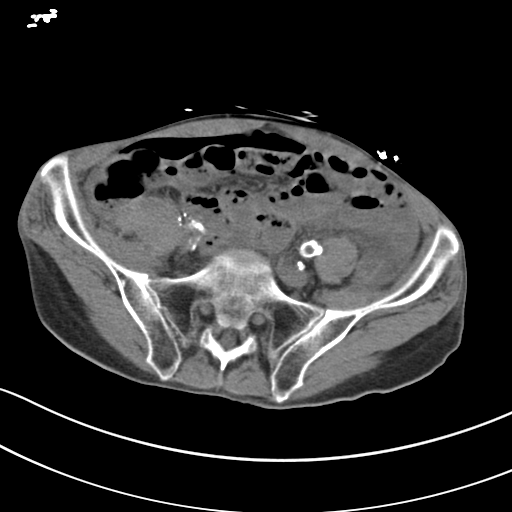
[im 38/86  soft-tissue]
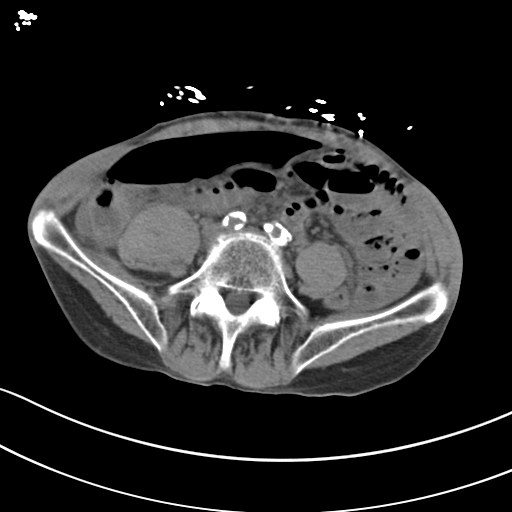
[im 45/86  soft-tissue]
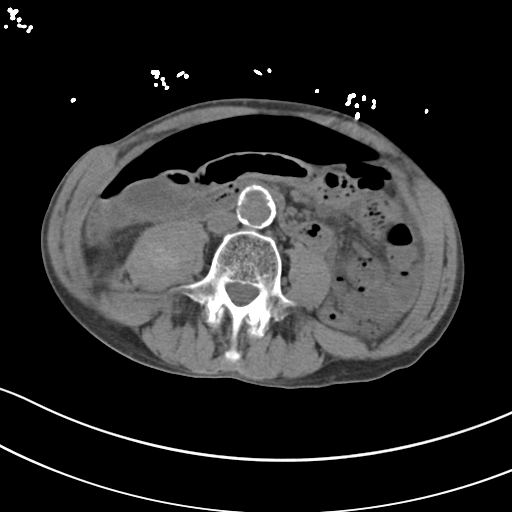
[im 48/86  soft-tissue]
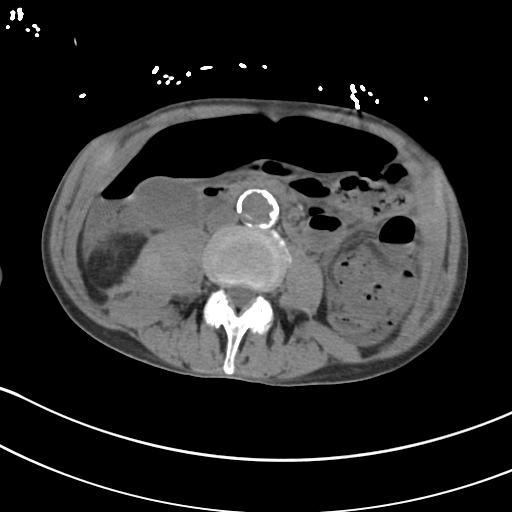
[im 55/86  soft-tissue]
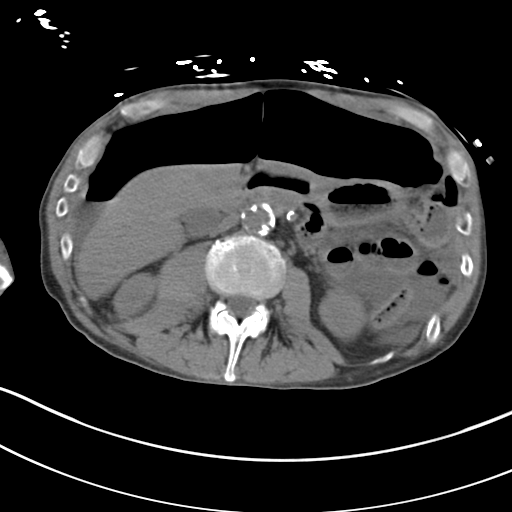
[im 55/86  bone]
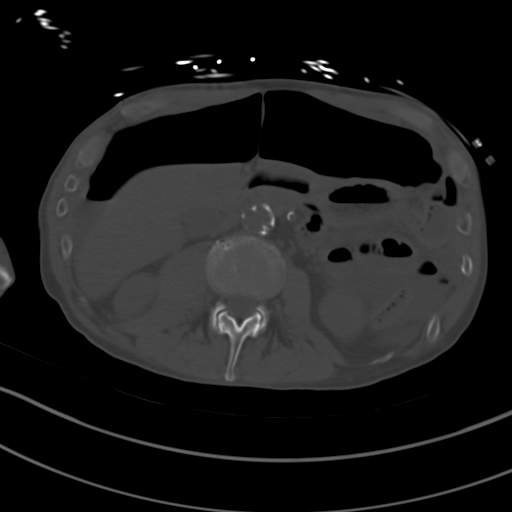
[im 62/86  soft-tissue]
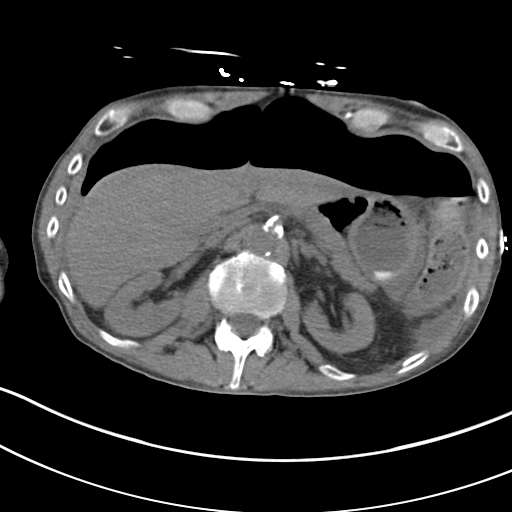
[im 69/86  soft-tissue]
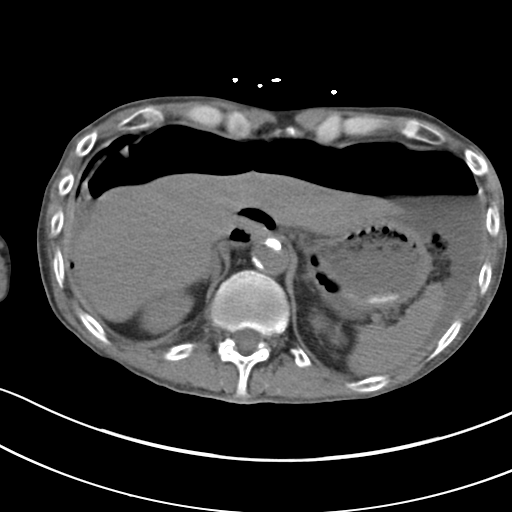
[im 72/86  lung]
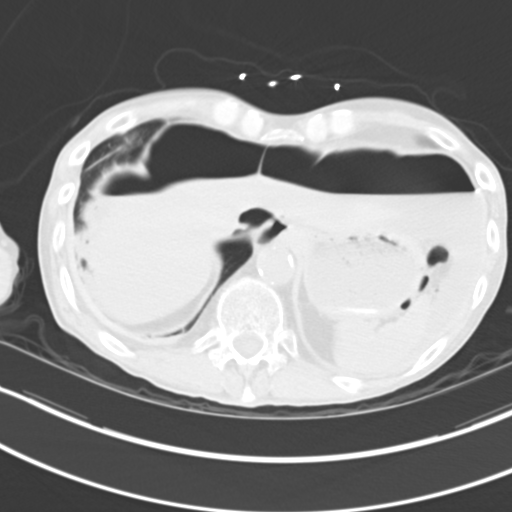
[im 75/86  soft-tissue]
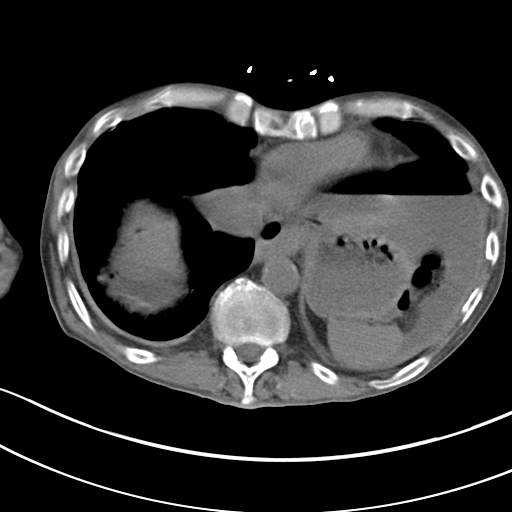
[im 75/86  lung]
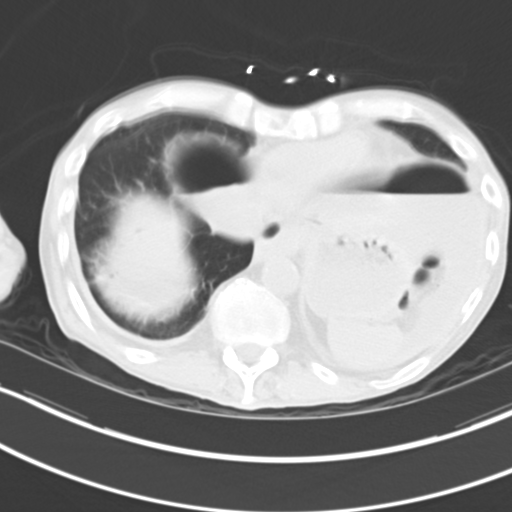
[im 79/86  lung]
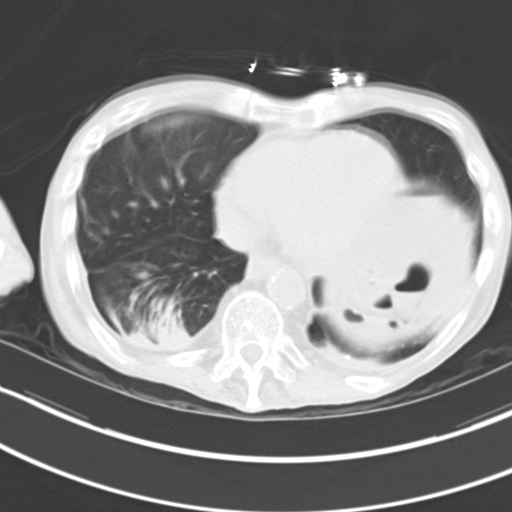
[im 82/86  soft-tissue]
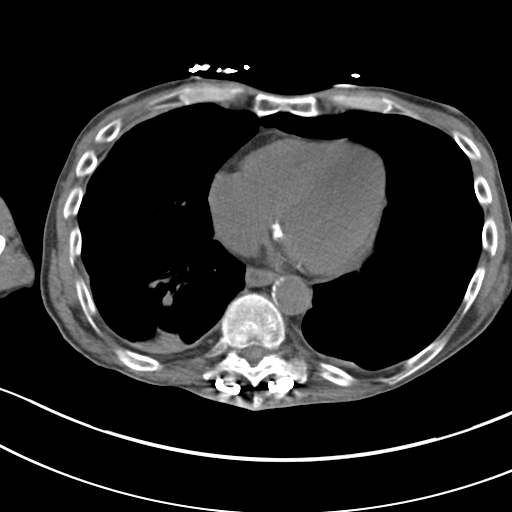
[im 82/86  lung]
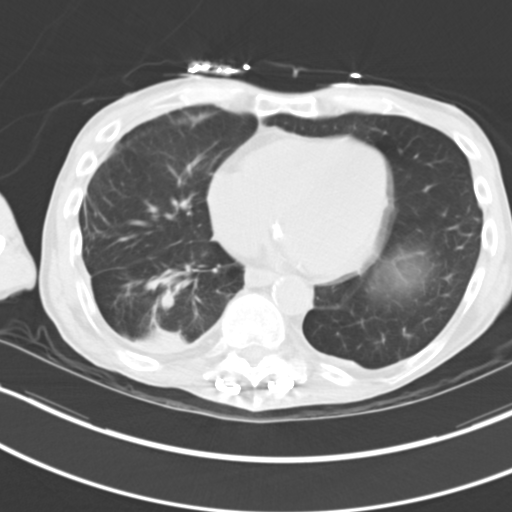

[15 of 32 positions shown; findings below may reference images not displayed]

FINDINGS: A large amount of pneumoperitoneum within the mid/ upper abdomen
with complex fluid containing foci of gas in the pelvis and
pericolic gutters - compatible with bowel perforation. Difficult to
determine the source of this perforation on the study.

Bibasilar atelectasis, right greater than left noted.

The liver, spleen, adrenal glands, pancreas and gallbladder are
unremarkable. Please note that parenchymal abnormalities may be
missed without intravenous contrast.

Abdominal aortic atherosclerotic calcifications are noted without
aneurysm. No definite enlarged lymph nodes identified.

A Foley catheter within the bladder is present.

The right psoas muscle appears enlarged in relation to the left
psoas muscle and suspicious for hematoma.

No acute or suspicious bony abnormalities are present.
IMPRESSION: Evidence of bowel perforation with pneumoperitoneum and complex
fluid and gas within the abdomen/pelvis. The site of bowel
perforation is not well delineated on this study.

Probable right psoas muscle hematoma.

Critical Value/emergent results were called by telephone at the time
of interpretation on 01/24/2014 at [DATE] to Dr. RUSMEL MALAVE
, who verbally acknowledged these results.
